# Patient Record
Sex: Male | Born: 1960 | Race: White | Hispanic: No | Marital: Married | Smoking: Never smoker
Health system: Southern US, Community
[De-identification: ages and names within clinical notes are randomized; demographics above are authoritative.]

## PROBLEM LIST (undated history)

## (undated) DIAGNOSIS — M5416 Radiculopathy, lumbar region: Secondary | ICD-10-CM

## (undated) DIAGNOSIS — M549 Dorsalgia, unspecified: Secondary | ICD-10-CM

## (undated) DIAGNOSIS — G8929 Other chronic pain: Secondary | ICD-10-CM

## (undated) DIAGNOSIS — E291 Testicular hypofunction: Secondary | ICD-10-CM

## (undated) DIAGNOSIS — M545 Low back pain, unspecified: Secondary | ICD-10-CM

## (undated) DIAGNOSIS — B070 Plantar wart: Secondary | ICD-10-CM

## (undated) DIAGNOSIS — M5090 Cervical disc disorder, unspecified, unspecified cervical region: Secondary | ICD-10-CM

## (undated) DIAGNOSIS — M542 Cervicalgia: Secondary | ICD-10-CM

## (undated) DIAGNOSIS — G8921 Chronic pain due to trauma: Secondary | ICD-10-CM

## (undated) DIAGNOSIS — R51 Headache: Secondary | ICD-10-CM

## (undated) DIAGNOSIS — R7611 Nonspecific reaction to tuberculin skin test without active tuberculosis: Secondary | ICD-10-CM

## (undated) DIAGNOSIS — I10 Essential (primary) hypertension: Secondary | ICD-10-CM

## (undated) DIAGNOSIS — B019 Varicella without complication: Secondary | ICD-10-CM

## (undated) DIAGNOSIS — E785 Hyperlipidemia, unspecified: Secondary | ICD-10-CM

## (undated) HISTORY — DX: Varicella without complication: B01.9

## (undated) HISTORY — DX: Cervical disc disorder, unspecified, unspecified cervical region: M50.90

## (undated) HISTORY — DX: Hyperlipidemia, unspecified: E78.5

## (undated) HISTORY — DX: Other chronic pain: G89.29

## (undated) HISTORY — DX: Headache: R51

## (undated) HISTORY — DX: Cervicalgia: M54.2

## (undated) HISTORY — DX: Dorsalgia, unspecified: M54.9

## (undated) HISTORY — DX: Nonspecific reaction to tuberculin skin test without active tuberculosis: R76.11

## (undated) HISTORY — DX: Low back pain: M54.5

## (undated) HISTORY — DX: Low back pain, unspecified: M54.50

## (undated) HISTORY — DX: Testicular hypofunction: E29.1

## (undated) HISTORY — PX: WISDOM TOOTH EXTRACTION: SHX21

## (undated) HISTORY — DX: Radiculopathy, lumbar region: M54.16

## (undated) HISTORY — DX: Essential (primary) hypertension: I10

## (undated) HISTORY — DX: Plantar wart: B07.0

## (undated) HISTORY — DX: Chronic pain due to trauma: G89.21

---

## 1982-09-21 HISTORY — PX: KNEE SURGERY: SHX244

## 1986-09-21 HISTORY — PX: APPENDECTOMY: SHX54

## 2000-09-21 HISTORY — PX: CHOLECYSTECTOMY: SHX55

## 2008-09-21 HISTORY — PX: LAMINECTOMY: SHX219

## 2009-09-21 HISTORY — PX: BACK SURGERY: SHX140

## 2011-12-23 ENCOUNTER — Ambulatory Visit (INDEPENDENT_AMBULATORY_CARE_PROVIDER_SITE_OTHER): Payer: Self-pay | Admitting: Family

## 2011-12-23 ENCOUNTER — Encounter: Payer: Self-pay | Admitting: Family

## 2011-12-23 VITALS — BP 170/100 | Temp 98.8°F | Ht 73.0 in | Wt 227.0 lb

## 2011-12-23 DIAGNOSIS — I10 Essential (primary) hypertension: Secondary | ICD-10-CM

## 2011-12-23 DIAGNOSIS — G8929 Other chronic pain: Secondary | ICD-10-CM

## 2011-12-23 DIAGNOSIS — M545 Low back pain, unspecified: Secondary | ICD-10-CM

## 2011-12-23 DIAGNOSIS — N529 Male erectile dysfunction, unspecified: Secondary | ICD-10-CM

## 2011-12-23 MED ORDER — AMLODIPINE BESYLATE 10 MG PO TABS: 10.0000 mg | ORAL_TABLET | Freq: Every day | ORAL | Status: DC

## 2011-12-23 MED ORDER — TADALAFIL 20 MG PO TABS: 10.0000 mg | ORAL_TABLET | ORAL | Status: DC | PRN

## 2011-12-23 MED ORDER — TRAMADOL HCL 50 MG PO TABS: 50.0000 mg | ORAL_TABLET | Freq: Three times a day (TID) | ORAL | Status: AC | PRN

## 2011-12-23 MED ORDER — LISINOPRIL-HYDROCHLOROTHIAZIDE 10-12.5 MG PO TABS: 1.0000 | ORAL_TABLET | Freq: Every day | ORAL | Status: DC
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
## 2011-12-23 NOTE — Patient Instructions (Addendum)
Hypertension As your heart beats, it forces blood through your arteries. This force is your blood pressure. If the pressure is too high, it is called hypertension (HTN) or high blood pressure. HTN is dangerous because you may have it and not know it. High blood pressure may mean that your heart has to work harder to pump blood. Your arteries may be narrow or stiff. The extra work puts you at risk for heart disease, stroke, and other problems.  Blood pressure consists of two numbers, a higher number over a lower, 110/72, for example. It is stated as "110 over 72." The ideal is below 120 for the top number (systolic) and under 80 for the bottom (diastolic). Write down your blood pressure today. You should pay close attention to your blood pressure if you have certain conditions such as:  Heart failure.   Prior heart attack.   Diabetes   Chronic kidney disease.   Prior stroke.   Multiple risk factors for heart disease.  To see if you have HTN, your blood pressure should be measured while you are seated with your arm held at the level of the heart. It should be measured at least twice. A one-time elevated blood pressure reading (especially in the Emergency Department) does not mean that you need treatment. There may be conditions in which the blood pressure is different between your right and left arms. It is important to see your caregiver soon for a recheck. Most people have essential hypertension which means that there is not a specific cause. This type of high blood pressure may be lowered by changing lifestyle factors such as:  Stress.   Smoking.   Lack of exercise.   Excessive weight.   Drug/tobacco/alcohol use.   Eating less salt.  Most people do not have symptoms from high blood pressure until it has caused damage to the body. Effective treatment can often prevent, delay or reduce that damage. TREATMENT  When a cause has been identified, treatment for high blood pressure is  directed at the cause. There are a large number of medications to treat HTN. These fall into several categories, and your caregiver will help you select the medicines that are best for you. Medications may have side effects. You should review side effects with your caregiver. If your blood pressure stays high after you have made lifestyle changes or started on medicines,   Your medication(s) may need to be changed.   Other problems may need to be addressed.   Be certain you understand your prescriptions, and know how and when to take your medicine.   Be sure to follow up with your caregiver within the time frame advised (usually within two weeks) to have your blood pressure rechecked and to review your medications.   If you are taking more than one medicine to lower your blood pressure, make sure you know how and at what times they should be taken. Taking two medicines at the same time can result in blood pressure that is too low.  SEEK IMMEDIATE MEDICAL CARE IF:  You develop a severe headache, blurred or changing vision, or confusion.   You have unusual weakness or numbness, or a faint feeling.   You have severe chest or abdominal pain, vomiting, or breathing problems.  MAKE SURE YOU:   Understand these instructions.   Will watch your condition.   Will get help right away if you are not doing well or get worse.  Document Released: 09/07/2005 Document Revised: 08/27/2011 Document Reviewed:   04/27/2008 ExitCare Patient Information 2012 [employer].  Exercise to Lose Weight Exercise and a healthy diet may help you lose weight. Your doctor may suggest specific exercises. EXERCISE IDEAS AND TIPS  Choose low-cost things you enjoy doing, such as walking, bicycling, or exercising to workout videos.   Take stairs instead of the elevator.   Walk during your lunch break.   Park your car further away from work or school.   Go to a gym or an exercise class.   Start with 5 to 10  minutes of exercise each day. Build up to 30 minutes of exercise 4 to 6 days a week.   Wear shoes with good support and comfortable clothes.   Stretch before and after working out.   Work out until you breathe harder and your heart beats faster.   Drink extra water when you exercise.   Do not do so much that you hurt yourself, feel dizzy, or get very short of breath.  Exercises that burn about 150 calories:  Running 1  miles in 15 minutes.   Playing volleyball for 45 to 60 minutes.   Washing and waxing a car for 45 to 60 minutes.   Playing touch football for 45 minutes.   Walking 1  miles in 35 minutes.   Pushing a stroller 1  miles in 30 minutes.   Playing basketball for 30 minutes.   Raking leaves for 30 minutes.   Bicycling 5 miles in 30 minutes.   Walking 2 miles in 30 minutes.   Dancing for 30 minutes.   Shoveling snow for 15 minutes.   Swimming laps for 20 minutes.   Walking up stairs for 15 minutes.   Bicycling 4 miles in 15 minutes.   Gardening for 30 to 45 minutes.   Jumping rope for 15 minutes.   Washing windows or floors for 45 to 60 minutes.  Document Released: 10/10/2010 Document Revised: 08/27/2011 Document Reviewed: 10/10/2010 Exit[employer] Patient Information 2012 [employer]. 

## 2011-12-29 ENCOUNTER — Telehealth: Payer: Self-pay | Admitting: Family Medicine

## 2011-12-29 MED ORDER — TIZANIDINE HCL 4 MG PO TABS: 4.0000 mg | ORAL_TABLET | Freq: Four times a day (QID) | ORAL | Status: AC | PRN

## 2011-12-29 NOTE — Telephone Encounter (Signed)
Pt now calls back and states he took an old zanaflex and really helped his back pain.  Requesting  a refill on the zanaflex  To [facility] [address] in [place], where he will be until Friday

## 2011-12-29 NOTE — Telephone Encounter (Signed)
Pulled from Triage vmail. Pt was off BP meds for a long time. Saw Padonda and she put him back on them. He is having a lot of cramping all over, especially in his back, where he has Hx of back surgery. [place] he can hardly move. Please call to advise if he should stop taking. Thanks.

## 2011-12-29 NOTE — Telephone Encounter (Signed)
Please call in Zanaflex for patient.

## 2011-12-29 NOTE — Telephone Encounter (Signed)
Rx sent to pharmacy and pt aware. 

## 2011-12-31 ENCOUNTER — Telehealth: Payer: Self-pay | Admitting: *Deleted

## 2011-12-31 MED ORDER — LISINOPRIL 10 MG PO TABS: 10.0000 mg | ORAL_TABLET | Freq: Every day | ORAL | Status: DC

## 2011-12-31 NOTE — Telephone Encounter (Signed)
Left message for pt to call back to notify him of change in medication and to return to office in 2 weeks. Rx for plain lisinopril sent to pharamcy

## 2011-12-31 NOTE — Telephone Encounter (Signed)
Start lisinopril 10mg  daily. Recheck in 2 weeks.

## 2011-12-31 NOTE — Telephone Encounter (Addendum)
Pt wanted Padonda to know the lisinopril/hctz caused him to become so dehydrated, he started to have severe back pains.  He  feels much better but feels he needs a different BP med.  He cannot come into the office today or tomorrow.  He states his entire body was cramping.

## 2012-01-06 ENCOUNTER — Other Ambulatory Visit: Payer: Self-pay

## 2012-01-13 ENCOUNTER — Encounter: Payer: Self-pay | Admitting: Family

## 2012-01-13 DIAGNOSIS — Z0289 Encounter for other administrative examinations: Secondary | ICD-10-CM

## 2012-02-16 ENCOUNTER — Telehealth: Payer: Self-pay | Admitting: Family

## 2012-02-16 NOTE — Telephone Encounter (Signed)
Pt called and said that the Fax # for [facility] in [place] is [phone]

## 2012-02-16 NOTE — Telephone Encounter (Signed)
Pt would like to have order for cpx labs drawn at [facility]. Pt is sch for cpx on 6-17. Pt will callback with fax#

## 2012-02-18 ENCOUNTER — Telehealth: Payer: Self-pay

## 2012-02-18 MED ORDER — TRAMADOL HCL 50 MG PO TABS
50.0000 mg | ORAL_TABLET | Freq: Three times a day (TID) | ORAL | Status: DC | PRN
Start: 2012-02-18 — End: 2014-06-04

## 2012-02-18 MED ORDER — TIZANIDINE HCL 4 MG PO CAPS: 4.0000 mg | ORAL_CAPSULE | Freq: Three times a day (TID) | ORAL | Status: DC

## 2012-02-18 NOTE — Telephone Encounter (Signed)
Orders faxed on 02/18/12 to number provided by pt

## 2012-02-18 NOTE — Telephone Encounter (Signed)
Sent to the pharmacy

## 2012-02-18 NOTE — Telephone Encounter (Signed)
Labs to include BMP, CBC, LIPIDS, PSA dx codes V70.0, V76.44, 401.1

## 2012-02-18 NOTE — Telephone Encounter (Signed)
Refill request for tizanidine 4mg  and tramadol 50 mg. Last filled 12/29/11 and 12/23/11

## 2012-03-07 ENCOUNTER — Encounter: Payer: Self-pay | Admitting: Family

## 2012-03-07 ENCOUNTER — Ambulatory Visit (INDEPENDENT_AMBULATORY_CARE_PROVIDER_SITE_OTHER): Payer: 59 | Admitting: Family

## 2012-03-07 VITALS — BP 140/94 | Ht 73.0 in | Wt 219.0 lb

## 2012-03-07 DIAGNOSIS — R7309 Other abnormal glucose: Secondary | ICD-10-CM

## 2012-03-07 DIAGNOSIS — E7800 Pure hypercholesterolemia, unspecified: Secondary | ICD-10-CM

## 2012-03-07 DIAGNOSIS — Z0000 Encounter for general adult medical examination without abnormal findings: Secondary | ICD-10-CM

## 2012-03-07 DIAGNOSIS — I10 Essential (primary) hypertension: Secondary | ICD-10-CM

## 2012-03-07 DIAGNOSIS — R739 Hyperglycemia, unspecified: Secondary | ICD-10-CM

## 2012-03-07 LAB — HEMOGLOBIN A1C: Hgb A1c MFr Bld: 6.2 % (ref 4.6–6.5)

## 2012-03-07 MED ORDER — LISINOPRIL 10 MG PO TABS: 10.0000 mg | ORAL_TABLET | Freq: Every day | ORAL | Status: DC

## 2012-03-07 MED ORDER — CLOTRIMAZOLE-BETAMETHASONE 1-0.05 % EX CREA: TOPICAL_CREAM | Freq: Two times a day (BID) | CUTANEOUS | Status: AC

## 2012-03-07 NOTE — Progress Notes (Signed)
Subjective:    Patient ID: Charles Jordan, male    DOB: [birth year], 51 y.o.   MRN: [id number]  HPI 51 year old white male,  patient presents for yearly preventative medicine examination. All immunizations and health maintenance protocols were reviewed with the patient and they are up to date with these protocols. Screening laboratory values were reviewed with the patient including screening of hyperlipidemia PSA renal function and hepatic function. There medications past medical history social history problem list and allergies were reviewed in detail. Goals were established with regard to weight loss exercise diet in compliance with medications  Patient has discontinued his lisinopril HCT. He reports a cough and dehydration and muscle aches. He continues to take Norvasc.   Review of Systems  Constitutional: Negative.   HENT: Negative.   Eyes: Negative.   Respiratory: Negative.   Cardiovascular: Negative.   Gastrointestinal: Negative.   Genitourinary: Negative.   Musculoskeletal: Negative.   Skin: Negative.   Neurological: Negative.   Hematological: Negative.   Psychiatric/Behavioral: Negative.    Past Medical History  Diagnosis Date  . Hyperlipidemia   . Hypertension   . Positive TB test   . Headache     History   Social History  . Marital Status: Married    Spouse Name: N/A    Number of Children: N/A  . Years of Education: N/A   Occupational History  . Not on file.   Social History Main Topics  . Smoking status: Never Smoker   . Smokeless tobacco: Not on file  . Alcohol Use: Yes  . Drug Use: No  . Sexually Active:    Other Topics Concern  . Not on file   Social History Narrative  . No narrative on file    Past Surgical History  Procedure Date  . Appendectomy   . Cholecystectomy   . Knee surgery     Family History  Problem Relation Age of Onset  . Adopted: Yes    No Known Allergies  Current Outpatient Prescriptions on File Prior to Visit    Medication Sig Dispense Refill  . amLODipine (NORVASC) 10 MG tablet Take 1 tablet (10 mg total) by mouth daily.  30 tablet  3  . tiZANidine (ZANAFLEX) 4 MG capsule Take 1 capsule (4 mg total) by mouth 3 (three) times daily.  90 capsule  2  . lisinopril-hydrochlorothiazide (PRINZIDE,ZESTORETIC) 10-12.5 MG per tablet Take 1 tablet by mouth daily.  30 tablet  3  . tadalafil (CIALIS) 20 MG tablet Take 0.5-1 tablets (10-20 mg total) by mouth every other day as needed for erectile dysfunction.  5 tablet  11  . DISCONTD: lisinopril (PRINIVIL) 10 MG tablet Take 1 tablet (10 mg total) by mouth daily.  30 tablet  0    BP 140/94  Ht 6\' 1"  (1.854 m)  Wt 219 lb (99.338 kg)  BMI 28.89 kg/m2chart     Objective:   Physical Exam  Constitutional: He is oriented to person, place, and time. He appears well-developed and well-nourished.  HENT:  Head: Normocephalic and atraumatic.  Right Ear: External ear normal.  Left Ear: External ear normal.  Nose: Nose normal.  Mouth/Throat: Oropharynx is clear and moist.  Eyes: Conjunctivae are normal. Pupils are equal, round, and reactive to light.  Neck: Normal range of motion. Neck supple.  Cardiovascular: Normal rate, regular rhythm, normal heart sounds and intact distal pulses.  Exam reveals no gallop.   No murmur heard. Pulmonary/Chest: Effort normal and breath sounds normal.  Abdominal: Soft. Bowel  sounds are normal.  Genitourinary: Rectum normal, prostate normal and penis normal. Guaiac negative stool. No penile tenderness.  Musculoskeletal: Normal range of motion.  Neurological: He is alert and oriented to person, place, and time. He has normal reflexes.  Skin: Skin is warm and dry.  Psychiatric: He has a normal mood and affect.          Assessment & Plan:  Assessment: Complete physical exam, hypertension, hyperlipidemia, erectile dysfunction  Plan: DC lisinopril HCT and start lisinopril 10 mg once daily. Continue Norvasc. Encouraged healthy diet  and exercise. Renew prescription for Cialis. We'll recheck  will patient in 6 months and sooner when necessary. A1c sent.

## 2012-03-07 NOTE — Patient Instructions (Signed)
Exercise to Stay Healthy  Exercise helps you become and stay healthy.    EXERCISE IDEAS AND TIPS  Choose exercises that:   You enjoy.   Fit into your day.  You do not need to exercise really hard to be healthy. You can do exercises at a slow or medium level and stay healthy. You can:   Stretch before and after working out.   Try yoga, Pilates, or tai chi.   Lift weights.   Walk fast, swim, jog, run, climb stairs, bicycle, dance, or rollerskate.   Take aerobic classes.    Exercises that burn about 150 calories:     Running 1  miles in 15 minutes.   Playing volleyball for 45 to 60 minutes.   Washing and waxing a car for 45 to 60 minutes.   Playing touch football for 45 minutes.   Walking 1  miles in 35 minutes.   Pushing a stroller 1  miles in 30 minutes.   Playing basketball for 30 minutes.   Raking leaves for 30 minutes.   Bicycling 5 miles in 30 minutes.   Walking 2 miles in 30 minutes.   Dancing for 30 minutes.   Shoveling snow for 15 minutes.   Swimming laps for 20 minutes.   Walking up stairs for 15 minutes.   Bicycling 4 miles in 15 minutes.   Gardening for 30 to 45 minutes.   Jumping rope for 15 minutes.   Washing windows or floors for 45 to 60 minutes.  Document Released: 10/10/2010 Document Revised: 08/27/2011 Document Reviewed: 10/10/2010  [employer] Patient Information 2012 [employer].

## 2012-03-09 ENCOUNTER — Encounter: Payer: Self-pay | Admitting: Family

## 2012-04-19 ENCOUNTER — Telehealth: Payer: Self-pay | Admitting: Family

## 2012-04-19 MED ORDER — TRAMADOL HCL 50 MG PO TABS: 50.0000 mg | ORAL_TABLET | Freq: Four times a day (QID) | ORAL | Status: DC | PRN

## 2012-04-19 NOTE — Telephone Encounter (Signed)
Caller: Charles Jordan/Patient; PCP: Adline Mango; CB#: [phone]; Call regarding Taking  Extra Ultram and Is Completely Out. Not Sure If They Are Working. Lower back is hurts at  L5- S1 area and pain radiates to R Hip. Normally They Work Good But Lately Not Helping;  He is wondering if a different Non Narcotic Medication can be called in- maybe a muscle  relaxer that would not make him sleepy. Triage per Back Sx Protocol and Disp changed to Call  Provider within 24 hours per caller request. Also, WOULD LIKE REFERRALTO SEE  NEUROLOGIST TO ADDRESS HEADACHES AND MEMORY LOSS. Uses [facility]  In [place].

## 2012-04-19 NOTE — Telephone Encounter (Signed)
Rx sent to pharmacy and note sent to [facility] for approval of neurology ref

## 2012-05-15 ENCOUNTER — Other Ambulatory Visit: Payer: Self-pay | Admitting: Family

## 2012-05-18 ENCOUNTER — Encounter: Payer: 59 | Admitting: Internal Medicine

## 2012-05-20 ENCOUNTER — Other Ambulatory Visit: Payer: Self-pay | Admitting: Family

## 2012-06-29 ENCOUNTER — Other Ambulatory Visit: Payer: Self-pay | Admitting: Family

## 2012-07-09 ENCOUNTER — Other Ambulatory Visit: Payer: Self-pay | Admitting: Family

## 2012-07-25 ENCOUNTER — Other Ambulatory Visit: Payer: Self-pay | Admitting: Family

## 2012-08-04 ENCOUNTER — Other Ambulatory Visit (HOSPITAL_COMMUNITY): Payer: Self-pay | Admitting: Family Medicine

## 2012-08-04 DIAGNOSIS — R202 Paresthesia of skin: Secondary | ICD-10-CM

## 2012-08-05 ENCOUNTER — Ambulatory Visit (HOSPITAL_COMMUNITY)
Admission: RE | Admit: 2012-08-05 | Discharge: 2012-08-05 | Disposition: A | Discharge status: Home / Self Care | Payer: 59 | Source: Ambulatory Visit | Attending: Family Medicine | Admitting: Family Medicine

## 2012-08-05 DIAGNOSIS — R202 Paresthesia of skin: Secondary | ICD-10-CM

## 2012-08-05 DIAGNOSIS — M5020 Other cervical disc displacement, unspecified cervical region: Secondary | ICD-10-CM | POA: Insufficient documentation

## 2012-08-25 ENCOUNTER — Other Ambulatory Visit: Payer: Self-pay | Admitting: Family

## 2012-09-02 ENCOUNTER — Other Ambulatory Visit: Payer: Self-pay | Admitting: Family

## 2012-09-21 HISTORY — PX: TOOTH EXTRACTION: SUR596

## 2012-10-20 ENCOUNTER — Other Ambulatory Visit: Payer: Self-pay

## 2012-10-20 MED ORDER — TIZANIDINE HCL 4 MG PO CAPS: 4.0000 mg | ORAL_CAPSULE | Freq: Three times a day (TID) | ORAL | Status: DC

## 2012-10-20 NOTE — Telephone Encounter (Signed)
1 MONTH SUPPLY SENT TO [facility] WITH NOTE STATING NO FURTHER REFILLS WITHOUT OV. MESSAGE LEFT ON BOTH CONTACT NUMBERS FOR PT ADVISING HIM TO RETURN CALL

## 2013-01-11 ENCOUNTER — Other Ambulatory Visit: Payer: Self-pay | Admitting: Family

## 2013-05-25 ENCOUNTER — Encounter: Payer: Self-pay | Admitting: Physician Assistant

## 2013-05-25 ENCOUNTER — Ambulatory Visit (INDEPENDENT_AMBULATORY_CARE_PROVIDER_SITE_OTHER): Payer: 59 | Admitting: Physician Assistant

## 2013-05-25 VITALS — BP 128/92 | HR 84 | Temp 98.1°F | Resp 16 | Ht 72.0 in | Wt 223.2 lb

## 2013-05-25 DIAGNOSIS — K219 Gastro-esophageal reflux disease without esophagitis: Secondary | ICD-10-CM

## 2013-05-25 DIAGNOSIS — G8929 Other chronic pain: Secondary | ICD-10-CM

## 2013-05-25 DIAGNOSIS — I10 Essential (primary) hypertension: Secondary | ICD-10-CM | POA: Insufficient documentation

## 2013-05-25 DIAGNOSIS — Z0000 Encounter for general adult medical examination without abnormal findings: Secondary | ICD-10-CM | POA: Insufficient documentation

## 2013-05-25 DIAGNOSIS — Z23 Encounter for immunization: Secondary | ICD-10-CM

## 2013-05-25 LAB — HEPATIC FUNCTION PANEL
ALT: 23 U/L (ref 0–53)
AST: 16 U/L (ref 0–37)
Alkaline Phosphatase: 89 U/L (ref 39–117)
Total Protein: 6.6 g/dL (ref 6.0–8.3)

## 2013-05-25 LAB — CBC WITH DIFFERENTIAL/PLATELET
Hemoglobin: 14.2 g/dL (ref 13.0–17.0)
Lymphocytes Relative: 26 % (ref 12–46)
Lymphs Abs: 1.5 10*3/uL (ref 0.7–4.0)
Neutrophils Relative %: 63 % (ref 43–77)
Platelets: 236 10*3/uL (ref 150–400)
RBC: 4.65 MIL/uL (ref 4.22–5.81)
WBC: 5.8 10*3/uL (ref 4.0–10.5)

## 2013-05-25 LAB — LIPID PANEL
HDL: 32 mg/dL — ABNORMAL LOW (ref >39)
Triglycerides: 1419 mg/dL — ABNORMAL HIGH (ref <150)

## 2013-05-25 LAB — BASIC METABOLIC PANEL
CO2: 26 mEq/L (ref 19–32)
Chloride: 101 mEq/L (ref 96–112)
Potassium: 3.7 mEq/L (ref 3.5–5.3)
Sodium: 138 mEq/L (ref 135–145)

## 2013-05-25 LAB — HEMOGLOBIN A1C: Mean Plasma Glucose: 137 mg/dL — ABNORMAL HIGH (ref <117)

## 2013-05-25 MED ORDER — OMEPRAZOLE 20 MG PO CPDR: 20.0000 mg | DELAYED_RELEASE_CAPSULE | Freq: Every day | ORAL | Status: DC

## 2013-05-25 MED ORDER — TRAMADOL HCL 50 MG PO TABS: 100.0000 mg | ORAL_TABLET | Freq: Three times a day (TID) | ORAL | Status: DC | PRN

## 2013-05-25 MED ORDER — LOSARTAN POTASSIUM 25 MG PO TABS: 25.0000 mg | ORAL_TABLET | Freq: Every day | ORAL | Status: DC

## 2013-05-25 NOTE — Assessment & Plan Note (Signed)
Most likely cause of chronic cough due to symptoms.  Trial of omeprazole.  Encouraged weight loss.  Patient instructed on proper diet and to avoid trigger foods, eating late at night, and elevating head of bed.  Follow-up in 1 month

## 2013-05-25 NOTE — Assessment & Plan Note (Addendum)
D/c lisinopril as potential cause of cough.  Rx for losartan to be added with daily amlodipine.  BP recheck in 1 month.  Encourage BP checks 2 x week.  Information on DASH diet given.

## 2013-05-25 NOTE — Assessment & Plan Note (Signed)
Fasting labs today.  Influenza vaccination given.  Referral to GI for colonoscopy.
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
## 2013-05-25 NOTE — Patient Instructions (Addendum)
Please stop lisinopril and start the losartan (cozaar) daily.  Check BP at home.  Start omeprazole daily 30 minutes before breakfast.  Please read information below about acid reflux and lifestyle changes to help with symptoms You can take 2 of the tramadol every 6-8 hours for pain.   I want to see you in 1 month to reassess blood pressure and symptoms.  I will call you with your lab results.    Gastroesophageal Reflux Disease, Adult Gastroesophageal reflux disease (GERD) happens when acid from your stomach flows up into the esophagus. When acid comes in contact with the esophagus, the acid causes soreness (inflammation) in the esophagus. Over time, GERD may create small holes (ulcers) in the lining of the esophagus. CAUSES   Increased body weight. This puts pressure on the stomach, making acid rise from the stomach into the esophagus.  Smoking. This increases acid production in the stomach.  Drinking alcohol. This causes decreased pressure in the lower esophageal sphincter (valve or ring of muscle between the esophagus and stomach), allowing acid from the stomach into the esophagus.  Late evening meals and a full stomach. This increases pressure and acid production in the stomach.  A malformed lower esophageal sphincter. Sometimes, no cause is found. SYMPTOMS   Burning pain in the lower part of the mid-chest behind the breastbone and in the mid-stomach area. This may occur twice a week or more often.  Trouble swallowing.  Sore throat.  Dry cough.  Asthma-like symptoms including chest tightness, shortness of breath, or wheezing. DIAGNOSIS  Your caregiver may be able to diagnose GERD based on your symptoms. In some cases, X-rays and other tests may be done to check for complications or to check the condition of your stomach and esophagus. TREATMENT  Your caregiver may recommend over-the-counter or prescription medicines to help decrease acid production. Ask your caregiver before  starting or adding any new medicines.  HOME CARE INSTRUCTIONS   Change the factors that you can control. Ask your caregiver for guidance concerning weight loss, quitting smoking, and alcohol consumption.  Avoid foods and drinks that make your symptoms worse, such as:  Caffeine or alcoholic drinks.  Chocolate.  Peppermint or mint flavorings.  Garlic and onions.  Spicy foods.  Citrus fruits, such as oranges, lemons, or limes.  Tomato-based foods such as sauce, chili, salsa, and pizza.  Fried and fatty foods.  Avoid lying down for the 3 hours prior to your bedtime or prior to taking a nap.  Eat small, frequent meals instead of large meals.  Wear loose-fitting clothing. Do not wear anything tight around your waist that causes pressure on your stomach.  Raise the head of your bed 6 to 8 inches with wood blocks to help you sleep. Extra pillows will not help.  Only take over-the-counter or prescription medicines for pain, discomfort, or fever as directed by your caregiver.  Do not take aspirin, ibuprofen, or other nonsteroidal anti-inflammatory drugs (NSAIDs). SEEK IMMEDIATE MEDICAL CARE IF:   You have pain in your arms, neck, jaw, teeth, or back.  Your pain increases or changes in intensity or duration.  You develop nausea, vomiting, or sweating (diaphoresis).  You develop shortness of breath, or you faint.  Your vomit is green, yellow, black, or looks like coffee grounds or blood.  Your stool is red, bloody, or black. These symptoms could be signs of other problems, such as heart disease, gastric bleeding, or esophageal bleeding. MAKE SURE YOU:   Understand these instructions.  Will watch  your condition.  Will get help right away if you are not doing well or get worse. Document Released: 06/17/2005 Document Revised: 11/30/2011 Document Reviewed: 03/27/2011 [facility] Patient Information 2014 [place], [place].

## 2013-05-25 NOTE — Assessment & Plan Note (Signed)
Increase tramadol to 100 mg from 50 mg.  If pain worsens, will need return to orthopedics or possible pain management referral.

## 2013-05-26 ENCOUNTER — Telehealth: Payer: Self-pay | Admitting: Physician Assistant

## 2013-05-26 DIAGNOSIS — E781 Pure hyperglyceridemia: Secondary | ICD-10-CM | POA: Insufficient documentation

## 2013-05-26 LAB — URINALYSIS, MICROSCOPIC ONLY
Bacteria, UA: NONE SEEN
Casts: NONE SEEN
Crystals: NONE SEEN

## 2013-05-26 LAB — URINALYSIS, ROUTINE W REFLEX MICROSCOPIC
Hgb urine dipstick: NEGATIVE
Ketones, ur: NEGATIVE mg/dL
Nitrite: NEGATIVE
pH: 5.5 (ref 5.0–8.0)

## 2013-05-26 MED ORDER — FENOFIBRATE 48 MG PO TABS: 48.0000 mg | ORAL_TABLET | Freq: Every day | ORAL | Status: DC

## 2013-05-26 NOTE — Telephone Encounter (Signed)
Please inform patient that his lab results are in.  His triglycerides are extremely elevated so I want him to start a diet low in fatty foods and refined sugars.  More lean protein (grilled chicken), fruits and vegetables.  Water instead of sodas.  Also he needs to start taking a daily krill oil supplement over the counter.  I pill per day.  Also I have sent in a prescription for fenofibrate that he is to take daily.  I will need to see him in 1 month to recheck his labs.  Patient should also be aware that his A1C places him in the pre-diabetic range but he is very close to diagnosis of diabetes.  Encourage diet (as above) and exercise to help lose weight.  Will recheck his A1C in 3 months

## 2013-05-26 NOTE — Telephone Encounter (Signed)
Patient informed, understood & agreed; has appt scheduled 10.08.14/SLS

## 2013-05-26 NOTE — Assessment & Plan Note (Signed)
Rx fenofibrate.  Daily krill oil.  Follow-up in 1 month.

## 2013-05-29 ENCOUNTER — Telehealth: Payer: Self-pay | Admitting: Physician Assistant

## 2013-05-29 NOTE — Telephone Encounter (Signed)
Received medical records from [facility]  P: [phone] F: [phone]

## 2013-06-05 ENCOUNTER — Telehealth: Payer: Self-pay | Admitting: Physician Assistant

## 2013-06-05 NOTE — Telephone Encounter (Signed)
Received medical records from [facility] °

## 2013-06-14 ENCOUNTER — Encounter: Payer: Self-pay | Admitting: Physician Assistant

## 2013-06-16 NOTE — Telephone Encounter (Signed)
Reply sent and provider verified 09.26.14/SLS

## 2013-06-28 ENCOUNTER — Ambulatory Visit: Payer: 59 | Admitting: Physician Assistant

## 2013-06-29 ENCOUNTER — Encounter: Payer: Self-pay | Admitting: Physician Assistant

## 2013-06-29 ENCOUNTER — Ambulatory Visit (INDEPENDENT_AMBULATORY_CARE_PROVIDER_SITE_OTHER): Payer: 59 | Admitting: Physician Assistant

## 2013-06-29 VITALS — BP 126/90 | HR 63 | Temp 98.5°F | Resp 16 | Ht 72.0 in | Wt 212.8 lb

## 2013-06-29 DIAGNOSIS — G8929 Other chronic pain: Secondary | ICD-10-CM

## 2013-06-29 DIAGNOSIS — I10 Essential (primary) hypertension: Secondary | ICD-10-CM

## 2013-06-29 DIAGNOSIS — E781 Pure hyperglyceridemia: Secondary | ICD-10-CM

## 2013-06-29 LAB — COMPREHENSIVE METABOLIC PANEL WITH GFR
ALT: 29 U/L (ref 0–53)
AST: 22 U/L (ref 0–37)
Albumin: 4.6 g/dL (ref 3.5–5.2)
Alkaline Phosphatase: 76 U/L (ref 39–117)
BUN: 19 mg/dL (ref 6–23)
CO2: 32 meq/L (ref 19–32)
Calcium: 9.6 mg/dL (ref 8.4–10.5)
Chloride: 100 meq/L (ref 96–112)
Creat: 1.01 mg/dL (ref 0.50–1.35)
Glucose, Bld: 102 mg/dL — ABNORMAL HIGH (ref 70–99)
Potassium: 4.2 meq/L (ref 3.5–5.3)
Sodium: 140 meq/L (ref 135–145)
Total Bilirubin: 0.9 mg/dL (ref 0.3–1.2)
Total Protein: 6.8 g/dL (ref 6.0–8.3)

## 2013-06-29 LAB — LIPID PANEL
Cholesterol: 158 mg/dL (ref 0–200)
HDL: 48 mg/dL
LDL Cholesterol: 96 mg/dL (ref 0–99)
Total CHOL/HDL Ratio: 3.3 ratio
Triglycerides: 69 mg/dL
VLDL: 14 mg/dL (ref 0–40)

## 2013-06-29 NOTE — Assessment & Plan Note (Signed)
Will recheck fasting lipid profile today. Will alter treatment is necessary

## 2013-06-29 NOTE — Patient Instructions (Signed)
Please obtain labs.  i will call you with your results.  Please continue all medications as prescribed.  Continue with diet and exercise changes.

## 2013-06-29 NOTE — Assessment & Plan Note (Signed)
Continue current medication regimen.    Continue with diet and exercise

## 2013-06-29 NOTE — Assessment & Plan Note (Signed)
Well controlled with tramadol.
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
## 2013-07-02 ENCOUNTER — Encounter: Payer: Self-pay | Admitting: Physician Assistant

## 2013-07-03 ENCOUNTER — Telehealth: Payer: Self-pay | Admitting: Physician Assistant

## 2013-07-03 ENCOUNTER — Other Ambulatory Visit: Payer: Self-pay | Admitting: *Deleted

## 2013-07-03 DIAGNOSIS — G8929 Other chronic pain: Secondary | ICD-10-CM

## 2013-07-03 MED ORDER — TRAMADOL HCL 50 MG PO TABS: 100.0000 mg | ORAL_TABLET | Freq: Three times a day (TID) | ORAL | Status: DC | PRN

## 2013-07-03 NOTE — Telephone Encounter (Signed)
Please Advise/SLS  

## 2013-07-03 NOTE — Progress Notes (Signed)
Per provider VO, called pharmacy for instructions for early refill & was instructed to send new Rx with note: [Ok to fill early 10.13.14]; Rx printed, signed & faxed to [facility]. Pt will be informed via response in My Chart/SLS

## 2013-07-03 NOTE — Telephone Encounter (Signed)
Sent MyChart message to patient.  Refill tramadol with note to pharmacy that it is ok to refill today.  Will need pain management referral if wanting chronic Rx pain management.

## 2013-07-22 ENCOUNTER — Other Ambulatory Visit: Payer: Self-pay | Admitting: Physician Assistant

## 2013-09-08 ENCOUNTER — Other Ambulatory Visit: Payer: Self-pay | Admitting: Physician Assistant

## 2013-09-08 DIAGNOSIS — G8929 Other chronic pain: Secondary | ICD-10-CM

## 2013-09-11 NOTE — Telephone Encounter (Signed)
Refill given.  Quantity 90 with no additional refills.  Rx printed, signed and ready for fax or pickup.

## 2013-09-11 NOTE — Telephone Encounter (Signed)
Rx faxed to [facility]; [facility] with contact name and number RE: requested Rx to pharmacy/SLS

## 2013-09-11 NOTE — Telephone Encounter (Signed)
eScribe request for refill on Tramadol Last filled - 10.13.14, #[id number] Last AEX - 10.09.14 Next AEX - 2 Months, no appointment scheduled Please Advise/SLS

## 2013-10-06 ENCOUNTER — Other Ambulatory Visit: Payer: Self-pay | Admitting: *Deleted

## 2013-10-06 DIAGNOSIS — E781 Pure hyperglyceridemia: Secondary | ICD-10-CM

## 2013-10-06 MED ORDER — LOSARTAN POTASSIUM 25 MG PO TABS: ORAL_TABLET | ORAL | Status: DC

## 2013-10-06 MED ORDER — FENOFIBRATE 48 MG PO TABS: 48.0000 mg | ORAL_TABLET | Freq: Every day | ORAL | Status: DC

## 2013-10-06 NOTE — Telephone Encounter (Signed)
Rx request to pharmacy/SLS  

## 2013-10-17 ENCOUNTER — Encounter: Payer: Self-pay | Admitting: Physician Assistant

## 2013-10-18 ENCOUNTER — Encounter: Payer: Self-pay | Admitting: Physician Assistant

## 2013-10-18 NOTE — Telephone Encounter (Signed)
Will check with Admin side of office for further information on My Chart proxy forms/SLS

## 2013-10-27 ENCOUNTER — Encounter: Payer: Self-pay | Admitting: Physician Assistant

## 2013-10-27 ENCOUNTER — Ambulatory Visit (INDEPENDENT_AMBULATORY_CARE_PROVIDER_SITE_OTHER): Payer: 59 | Admitting: Physician Assistant

## 2013-10-27 VITALS — BP 130/78 | HR 71 | Temp 97.5°F | Resp 16 | Ht 72.0 in | Wt 227.2 lb

## 2013-10-27 DIAGNOSIS — B351 Tinea unguium: Secondary | ICD-10-CM

## 2013-10-27 DIAGNOSIS — L919 Hypertrophic disorder of the skin, unspecified: Secondary | ICD-10-CM

## 2013-10-27 DIAGNOSIS — G8929 Other chronic pain: Secondary | ICD-10-CM

## 2013-10-27 DIAGNOSIS — E291 Testicular hypofunction: Secondary | ICD-10-CM

## 2013-10-27 DIAGNOSIS — L909 Atrophic disorder of skin, unspecified: Secondary | ICD-10-CM

## 2013-10-27 DIAGNOSIS — L03039 Cellulitis of unspecified toe: Secondary | ICD-10-CM

## 2013-10-27 DIAGNOSIS — L0390 Cellulitis, unspecified: Secondary | ICD-10-CM

## 2013-10-27 DIAGNOSIS — L03032 Cellulitis of left toe: Secondary | ICD-10-CM

## 2013-10-27 DIAGNOSIS — IMO0002 Reserved for concepts with insufficient information to code with codable children: Secondary | ICD-10-CM

## 2013-10-27 DIAGNOSIS — L0291 Cutaneous abscess, unspecified: Secondary | ICD-10-CM

## 2013-10-27 DIAGNOSIS — R7989 Other specified abnormal findings of blood chemistry: Secondary | ICD-10-CM

## 2013-10-27 DIAGNOSIS — L918 Other hypertrophic disorders of the skin: Secondary | ICD-10-CM

## 2013-10-27 LAB — CBC WITH DIFFERENTIAL/PLATELET
Basophils Absolute: 0 10*3/uL (ref 0.0–0.1)
Basophils Relative: 0.6 % (ref 0.0–3.0)
EOS PCT: 2.2 % (ref 0.0–5.0)
Eosinophils Absolute: 0.2 10*3/uL (ref 0.0–0.7)
HEMATOCRIT: 44.6 % (ref 39.0–52.0)
HEMOGLOBIN: 14.6 g/dL (ref 13.0–17.0)
Lymphocytes Relative: 35.8 % (ref 12.0–46.0)
Lymphs Abs: 2.6 10*3/uL (ref 0.7–4.0)
MCHC: 32.7 g/dL (ref 30.0–36.0)
MCV: 93.2 fl (ref 78.0–100.0)
MONO ABS: 0.5 10*3/uL (ref 0.1–1.0)
MONOS PCT: 6.2 % (ref 3.0–12.0)
NEUTROS ABS: 4.1 10*3/uL (ref 1.4–7.7)
Neutrophils Relative %: 55.2 % (ref 43.0–77.0)
Platelets: 266 10*3/uL (ref 150.0–400.0)
RBC: 4.79 Mil/uL (ref 4.22–5.81)
RDW: 12.5 % (ref 11.5–14.6)
WBC: 7.4 10*3/uL (ref 4.5–10.5)

## 2013-10-27 LAB — FOLLICLE STIMULATING HORMONE: FSH: 5.7 m[IU]/mL (ref 1.4–18.1)

## 2013-10-27 LAB — LUTEINIZING HORMONE: LH: 2.4 m[IU]/mL (ref 1.50–9.30)

## 2013-10-27 MED ORDER — SULFAMETHOXAZOLE-TMP DS 800-160 MG PO TABS: 1.0000 | ORAL_TABLET | Freq: Two times a day (BID) | ORAL | Status: DC
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
## 2013-10-27 NOTE — Progress Notes (Signed)
Pre visit review using our clinic review tool, if applicable. No additional management support is needed unless otherwise documented below in the visit note/SLS  

## 2013-10-27 NOTE — Patient Instructions (Signed)
Please take antibiotic as prescribed.  Soak foot in peroxide nightly.  Keep clean, dry and covered.  You will be contacted by podiatry for assessment and treatment -- Appointment is on Wednesday 11/01/13 at 1:45 pm with [facility].  If you develop worsening pain, redness, fever or chills, please proceed to the ER. Please send in your records of previous MRIs so that we can proceed with referrals.  Go to lab.  I will call you with your results.  We will start therapy if indicated.

## 2013-10-30 DIAGNOSIS — R7989 Other specified abnormal findings of blood chemistry: Secondary | ICD-10-CM | POA: Insufficient documentation

## 2013-10-30 DIAGNOSIS — IMO0002 Reserved for concepts with insufficient information to code with codable children: Secondary | ICD-10-CM | POA: Insufficient documentation

## 2013-10-30 DIAGNOSIS — L918 Other hypertrophic disorders of the skin: Secondary | ICD-10-CM | POA: Insufficient documentation

## 2013-10-30 DIAGNOSIS — B351 Tinea unguium: Secondary | ICD-10-CM | POA: Insufficient documentation

## 2013-10-30 LAB — TESTOSTERONE, FREE, TOTAL, SHBG
SEX HORMONE BINDING: 28 nmol/L (ref 13–71)
TESTOSTERONE FREE: 53.5 pg/mL (ref 47.0–244.0)
Testosterone-% Free: 2.1 % (ref 1.6–2.9)
Testosterone: 252 ng/dL — ABNORMAL LOW (ref 300–890)

## 2013-10-30 NOTE — Assessment & Plan Note (Signed)
Need to obtain recent MRI imaging and notes from previous caregiver.  Will place referral to pain clinic once records have been obtained.

## 2013-10-30 NOTE — Assessment & Plan Note (Signed)
Patient with extensive disease.  Also an abnormal growth of nail bed of Left great toe.  Will refer to Podiatry for urgent evaluation.  Will defer treatment for onychomycosis to Podiatry for now.  Will happily start therapy myself, once the urgent concerns are addressed by specialist.

## 2013-10-30 NOTE — Assessment & Plan Note (Signed)
Right Axillary x 2.  Area was prepped/cleaned with alcohol.  Clean technique observed.  Anesthesia with 1% lidocaine applied to base of each skin tag.  Skin tags removed via simple excision. Bleeding minimal and self-resolving.  Area was dressed and bandaged.

## 2013-10-30 NOTE — Assessment & Plan Note (Signed)
Patient endorses history of Low T.  Wants to restart medication.  Will obtain free and total Testosterone levels, LH and FSH. Will begin therapy if clinically indicated.

## 2013-10-30 NOTE — Assessment & Plan Note (Signed)
Great toe of left foot.  With evidence of trauma and drainage.  Encouraged foot soaks with dilute peroxide.  Discusses cleaning and dressing measures. Rx ABX.  No bony tenderness noted on examination.  Lower concern for osteomyelitis.  However, discussed alarm signs/symptoms with the patient and when to proceed to ER.  Urgent referral to podiatry placed.

## 2013-11-01 ENCOUNTER — Ambulatory Visit (INDEPENDENT_AMBULATORY_CARE_PROVIDER_SITE_OTHER): Payer: 59 | Admitting: Podiatrist

## 2013-11-01 ENCOUNTER — Encounter: Payer: Self-pay | Admitting: *Deleted

## 2013-11-01 VITALS — BP 127/84 | HR 84 | Resp 16 | Ht 73.0 in | Wt 227.0 lb

## 2013-11-01 DIAGNOSIS — L03039 Cellulitis of unspecified toe: Secondary | ICD-10-CM

## 2013-11-01 MED ORDER — TERBINAFINE HCL 250 MG PO TABS: 250.0000 mg | ORAL_TABLET | Freq: Every day | ORAL | Status: DC

## 2013-11-01 NOTE — Patient Instructions (Signed)
ANTIBACTERIAL SOAP INSTRUCTIONS  THE DAY AFTER PROCEDURE  Please follow the instructions your doctor has marked.   Shower as usual. Before getting out, place a drop of antibacterial liquid soap (Dial) on a wet, clean washcloth.  Gently wipe washcloth over affected area.  Afterward, rinse the area with warm   water.  Blot the area dry with a soft cloth and cover with antibiotic ointment (neosporin, polysporin, bacitracin) and band aid or gauze and tape    OR Place 3-4 drops of antibacterial liquid soap in a quart of warm tap water.  Submerge foot into water for 20 minutes.  If bandage was applied after your procedure, leave on to allow for easy lift off,                          then remove and continue with soak for the remaining time.  Next, blot area dry with a soft cloth and cover with a bandage.  Apply other medications as directed by your doctor, such as cortisporin otic solution (eardrops) or neosporin antibiotic ointment    Onychomycosis/Fungal Toenails  WHAT IS IT? An infection that lies within the keratin of your nail plate that is caused by a fungus.  WHY ME? Fungal infections affect all ages, sexes, races, and creeds.  There may be many factors that predispose you to a fungal infection such as age, coexisting medical conditions such as diabetes, or an autoimmune disease; stress, medications, fatigue, genetics, etc.  Bottom line: fungus thrives in a warm, moist environment and your shoes offer such a location.  IS IT CONTAGIOUS? Theoretically, yes.  You do not want to share shoes, nail clippers or files with someone who has fungal toenails.  Walking around barefoot in the same room or sleeping in the same bed is unlikely to transfer the organism.  It is important to realize, however, that fungus can spread easily from one nail to the next on the same foot.  HOW DO WE TREAT THIS?  There are several ways to treat this condition.  Treatment may depend on many factors such as age,  medications, pregnancy, liver and kidney conditions, etc.  It is best to ask your doctor which options are available to you.  1. No treatment.   Unlike many other medical concerns, you can live with this condition.  However for many people this can be a painful condition and may lead to ingrown toenails or a bacterial infection.  It is recommended that you keep the nails cut short to help reduce the amount of fungal nail. 2. Topical treatment.  These range from herbal remedies to prescription strength nail lacquers.  About 40-50% effective, topicals require twice daily application for approximately 9 to 12 months or until an entirely new nail has grown out.  The most effective topicals are medical grade medications available through physicians offices. 3. Oral antifungal medications.  With an 80-90% cure rate, the most common oral medication requires 3 to 4 months of therapy and stays in your system for a year as the new nail grows out.  Oral antifungal medications do require blood work to make sure it is a safe drug for you.  A liver function panel will be performed prior to starting the medication and after the first month of treatment.  It is important to have the blood work performed to avoid any harmful side effects.  In general, this medication safe but blood work is required. 4. Laser Therapy.  This treatment is performed by applying a specialized laser to the affected nail plate.  This therapy is noninvasive, fast, and non-painful.  It is not covered by insurance and is therefore, out of pocket.  The results have been very good with a 80-95% cure rate.  [facility] is the only practice in the area to offer this therapy. 5. Permanent Nail Avulsion.  Removing the entire nail so that a new nail will not grow back.

## 2013-11-01 NOTE — Progress Notes (Signed)
   Subjective:    Patient ID: Charles HawkingJames C Ewell, male    DOB: 04-Jan-1961, 53 y.o.   MRN: [id number]  HPI Comments: "I have nail fungus"  Patient c/o of thick, brittle, discolored toenails for years, mainly the 1st bilateral. The left great toe has gotten infected at the medial corner. PCP Rx'd Biaxin, been soaking, using a compression bandage. The area is swollen, red, and draining. He has used Lamisil previously before, but only 1 months worth.       Review of Systems  Musculoskeletal: Positive for back pain.  All other systems reviewed and are negative.       Objective:   Physical Exam GENERAL APPEARANCE: Alert, conversant. Appropriately groomed. No acute distress.  VASCULAR: Pedal pulses palpable at 2/4 DP and PT bilateral.  Capillary refill time is immediate to all digits,  Proximal to distal cooling it warm to warm.  Digital hair growth is present bilateral  NEUROLOGIC: sensation is intact epicritically and protectively to 5.07 monofilament at 5/5 sites bilateral.  Light touch is intact bilateral, vibratory sensation intact bilateral, achilles tendon reflex is intact bilateral.  MUSCULOSKELETAL: acceptable muscle strength, tone and stability bilateral.  Intrinsic muscluature intact bilateral.  Rectus appearance of foot and digits noted bilateral.   DERMATOLOGIC: left hallux nail is inflammed and ingrown on the medial nail border.  No pus or piruence expressed, discomfort with pressure is present.  Bilateral hallux nails are thickened and mycotic in appearance.  Brittleness to the nail plate and discoloration present     Assessment & Plan:  Paronychia medial nail border left hallux with mycotic fungal nail infection  Plan:  Treatment options and alternatives discussed.  Recommended an incision and drainage and patient agreed.  Left hallux was prepped with alcohol and a 1 to 1 mix of 0.5% marcaine plain and 2% lidocaine plain was administered in a digital block fashion.  The toe was  then prepped with betadine solution and exsanguinated.  The offending nail border was then excised and all necrotic tissue was resected.  The area was then cleansed well and antibiotic ointment and a dry sterile dressing was applied.  The patient was dispensed instructions for aftercare.  He has already been on 1 month of Lamisil and check of records show recent labs to be normal.  I will re start him on the lamisil for 3 more months.  I will see him back for a recheck of the medicaiton and the toe in 1 month.

## 2013-11-17 ENCOUNTER — Telehealth: Payer: Self-pay | Admitting: *Deleted

## 2013-11-17 DIAGNOSIS — R7989 Other specified abnormal findings of blood chemistry: Secondary | ICD-10-CM

## 2013-11-17 NOTE — Telephone Encounter (Signed)
Message copied by Regis BillSCATES, Shinichi Anguiano L on Fri Nov 17, 2013 11:54 AM ------      Message from: Marcelline MatesMARTIN, WILLIAM      Created: Mon Nov 06, 2013  8:23 AM       Labs look good overall.  He does have just slightly decreased levels of testosterone.  Since it is so mildly decreased and other labs are normal, I would like to repeat his testosterone level in 2 weeks.  The earlier in the morning he can come to lab, the better -- 8 am if possible.  This will yield the most accurate results.  If still decreased we will initiate testosterone therapy. ------

## 2013-11-24 ENCOUNTER — Encounter: Payer: Self-pay | Admitting: Physician Assistant

## 2013-12-08 ENCOUNTER — Encounter: Payer: Self-pay | Admitting: Family

## 2013-12-08 ENCOUNTER — Ambulatory Visit (INDEPENDENT_AMBULATORY_CARE_PROVIDER_SITE_OTHER): Payer: 59 | Admitting: Family

## 2013-12-08 VITALS — BP 130/80 | HR 86 | Temp 98.5°F | Resp 16 | Ht 72.0 in | Wt 231.0 lb

## 2013-12-08 DIAGNOSIS — G8929 Other chronic pain: Secondary | ICD-10-CM

## 2013-12-08 DIAGNOSIS — J329 Chronic sinusitis, unspecified: Secondary | ICD-10-CM | POA: Insufficient documentation

## 2013-12-08 MED ORDER — AMOXICILLIN 500 MG PO CAPS: 500.0000 mg | ORAL_CAPSULE | Freq: Three times a day (TID) | ORAL | Status: DC

## 2013-12-08 MED ORDER — ALBUTEROL SULFATE HFA 108 (90 BASE) MCG/ACT IN AERS: 2.0000 | INHALATION_SPRAY | Freq: Four times a day (QID) | RESPIRATORY_TRACT | Status: DC | PRN

## 2013-12-08 MED ORDER — BENZONATATE 100 MG PO CAPS: 100.0000 mg | ORAL_CAPSULE | Freq: Three times a day (TID) | ORAL | Status: DC | PRN

## 2013-12-08 MED ORDER — TRAMADOL HCL 50 MG PO TABS: ORAL_TABLET | ORAL | Status: DC

## 2013-12-08 NOTE — Patient Instructions (Addendum)
Add claritin 10mg  once daily for nasal drainage. Start amoxicillin for sinus infection. Use albuterol every 6 hours as needed for cough/wheezing. Use tessalon as needed for cough suppression. Call if symptoms worsen, or if not improved in 3 days.
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
## 2013-12-08 NOTE — Progress Notes (Signed)
Pre visit review using our clinic review tool, if applicable. No additional management support is needed unless otherwise documented below in the visit note. 

## 2013-12-08 NOTE — Assessment & Plan Note (Signed)
Recommended that pt start amoxicillin for sinusitis and early R OM.  Add claritin for nasal congestion.   Tessalon and albuterol PRN for cough.  Follow up if symptoms worsen or if symptoms do not improve.

## 2014-01-09 ENCOUNTER — Encounter: Payer: Self-pay | Admitting: Physician Assistant

## 2014-01-11 ENCOUNTER — Other Ambulatory Visit: Payer: Self-pay | Admitting: Physician Assistant

## 2014-01-11 NOTE — Telephone Encounter (Signed)
Rx request to pharmacy/SLS  

## 2014-01-15 ENCOUNTER — Encounter: Payer: Self-pay | Admitting: Physician Assistant

## 2014-01-15 ENCOUNTER — Telehealth: Payer: Self-pay | Admitting: *Deleted

## 2014-01-15 DIAGNOSIS — G8929 Other chronic pain: Secondary | ICD-10-CM

## 2014-01-15 NOTE — Telephone Encounter (Signed)
Received fax [facility] requesting refill of tramadol. Last Rx given 12/08/13, #90.  Please advise.

## 2014-01-17 MED ORDER — TRAMADOL HCL 50 MG PO TABS: ORAL_TABLET | ORAL | Status: DC

## 2014-01-17 NOTE — Telephone Encounter (Signed)
Refill granted.  Rx printed and ready to be faxed or picked up.

## 2014-01-17 NOTE — Telephone Encounter (Signed)
Rx request faxed to pharmacy/[facility]  

## 2014-02-02 ENCOUNTER — Encounter: Payer: Self-pay | Admitting: Physician Assistant

## 2014-02-07 ENCOUNTER — Other Ambulatory Visit: Payer: Self-pay | Admitting: Physician Assistant

## 2014-02-07 DIAGNOSIS — M549 Dorsalgia, unspecified: Principal | ICD-10-CM

## 2014-02-07 DIAGNOSIS — G8929 Other chronic pain: Secondary | ICD-10-CM

## 2014-02-07 MED ORDER — OXYCODONE-ACETAMINOPHEN 5-325 MG PO TABS: 1.0000 | ORAL_TABLET | Freq: Three times a day (TID) | ORAL | Status: DC | PRN

## 2014-02-09 ENCOUNTER — Encounter: Payer: Self-pay | Admitting: Physician Assistant

## 2014-02-21 ENCOUNTER — Encounter: Payer: Self-pay | Admitting: Physician Assistant

## 2014-02-21 DIAGNOSIS — M5416 Radiculopathy, lumbar region: Secondary | ICD-10-CM

## 2014-02-21 DIAGNOSIS — G8929 Other chronic pain: Secondary | ICD-10-CM

## 2014-02-21 MED ORDER — TRAMADOL HCL 50 MG PO TABS: 100.0000 mg | ORAL_TABLET | Freq: Four times a day (QID) | ORAL | Status: DC | PRN

## 2014-03-12 ENCOUNTER — Encounter: Payer: Self-pay | Admitting: Physician Assistant

## 2014-03-12 ENCOUNTER — Other Ambulatory Visit (HOSPITAL_COMMUNITY): Payer: Self-pay | Admitting: Neurosurgery

## 2014-03-12 DIAGNOSIS — M549 Dorsalgia, unspecified: Secondary | ICD-10-CM

## 2014-03-14 ENCOUNTER — Ambulatory Visit (INDEPENDENT_AMBULATORY_CARE_PROVIDER_SITE_OTHER): Payer: 59 | Admitting: Family

## 2014-03-14 ENCOUNTER — Encounter: Payer: Self-pay | Admitting: Family

## 2014-03-14 VITALS — BP 130/90 | HR 88 | Temp 98.6°F | Ht 72.0 in | Wt 225.0 lb

## 2014-03-14 DIAGNOSIS — M545 Low back pain, unspecified: Secondary | ICD-10-CM

## 2014-03-14 DIAGNOSIS — G8929 Other chronic pain: Secondary | ICD-10-CM

## 2014-03-14 MED ORDER — TRAMADOL HCL 50 MG PO TABS: 100.0000 mg | ORAL_TABLET | Freq: Four times a day (QID) | ORAL | Status: DC | PRN

## 2014-03-14 MED ORDER — METHYLPREDNISOLONE 4 MG PO KIT: PACK | ORAL | Status: DC
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
## 2014-03-14 NOTE — Progress Notes (Signed)
Pre visit review using our clinic review tool, if applicable. No additional management support is needed unless otherwise documented below in the visit note/SLS  

## 2014-03-14 NOTE — Patient Instructions (Signed)
Start Medrol Dose Pak. You may continue to use tramadol as needed. Keep upcoming appointment for MRI and Neurosurgeon.  Call if symptoms worsen or if symptoms do not improve.

## 2014-03-14 NOTE — Assessment & Plan Note (Signed)
Deteriorated. Start Medrol Dose Pak. You may continue to use tramadol as needed. Keep upcoming appointment for MRI and Neurosurgeon.  Call if symptoms worsen or if symptoms do not improve.

## 2014-03-15 ENCOUNTER — Encounter: Payer: Self-pay | Admitting: Physician Assistant

## 2014-03-15 ENCOUNTER — Encounter: Payer: Self-pay | Admitting: Family

## 2014-03-21 ENCOUNTER — Encounter: Payer: Self-pay | Admitting: Physician Assistant

## 2014-03-21 DIAGNOSIS — M5416 Radiculopathy, lumbar region: Secondary | ICD-10-CM

## 2014-03-22 ENCOUNTER — Ambulatory Visit (HOSPITAL_COMMUNITY)
Admission: RE | Admit: 2014-03-22 | Discharge: 2014-03-22 | Disposition: A | Discharge status: Home / Self Care | Payer: 59 | Source: Ambulatory Visit | Attending: Neurosurgery | Admitting: Neurosurgery

## 2014-03-22 ENCOUNTER — Other Ambulatory Visit: Payer: Self-pay | Admitting: Family

## 2014-03-22 DIAGNOSIS — M549 Dorsalgia, unspecified: Secondary | ICD-10-CM

## 2014-03-22 DIAGNOSIS — M545 Low back pain, unspecified: Secondary | ICD-10-CM | POA: Insufficient documentation

## 2014-03-22 DIAGNOSIS — M5126 Other intervertebral disc displacement, lumbar region: Secondary | ICD-10-CM | POA: Insufficient documentation

## 2014-03-22 DIAGNOSIS — M5380 Other specified dorsopathies, site unspecified: Secondary | ICD-10-CM | POA: Insufficient documentation

## 2014-03-22 DIAGNOSIS — M48061 Spinal stenosis, lumbar region without neurogenic claudication: Secondary | ICD-10-CM | POA: Insufficient documentation

## 2014-03-22 DIAGNOSIS — M5146 Schmorl's nodes, lumbar region: Secondary | ICD-10-CM | POA: Insufficient documentation

## 2014-03-22 MED ORDER — GADOBENATE DIMEGLUMINE 529 MG/ML IV SOLN
20.0000 mL | Freq: Once | INTRAVENOUS | Status: AC | PRN
  Administered 2014-03-22: 20 mL via INTRAVENOUS

## 2014-03-23 MED ORDER — METHYLPREDNISOLONE 4 MG PO KIT: PACK | ORAL | Status: DC

## 2014-03-29 ENCOUNTER — Other Ambulatory Visit: Payer: Self-pay | Admitting: Neurosurgery

## 2014-03-29 DIAGNOSIS — M5126 Other intervertebral disc displacement, lumbar region: Secondary | ICD-10-CM

## 2014-04-10 ENCOUNTER — Other Ambulatory Visit: Payer: Self-pay | Admitting: Family

## 2014-04-10 DIAGNOSIS — G8929 Other chronic pain: Secondary | ICD-10-CM

## 2014-04-10 NOTE — Telephone Encounter (Signed)
Last rx given to pt 03/15/14, #60.  Please advise if ok to print Tramadol Rx?

## 2014-04-11 MED ORDER — TRAMADOL HCL 50 MG PO TABS: 100.0000 mg | ORAL_TABLET | Freq: Four times a day (QID) | ORAL | Status: DC | PRN

## 2014-04-11 NOTE — Telephone Encounter (Signed)
Will defer additional refills to his PCP.

## 2014-04-17 ENCOUNTER — Other Ambulatory Visit: Payer: Self-pay | Admitting: Physician Assistant

## 2014-04-17 ENCOUNTER — Telehealth: Payer: Self-pay | Admitting: Family

## 2014-04-17 DIAGNOSIS — G8929 Other chronic pain: Secondary | ICD-10-CM

## 2014-04-17 NOTE — Telephone Encounter (Signed)
Received request for refill of amlodipine.  I have this was last prescribed by Adline MangoPadonda Campbell in 2013.  The only medication I have given him for HTN is losartan.  Please verify this request with patient.  Will need office visit before I will prescribe this.

## 2014-04-18 ENCOUNTER — Ambulatory Visit
Admission: RE | Admit: 2014-04-18 | Discharge: 2014-04-18 | Disposition: A | Discharge status: Home / Self Care | Payer: 59 | Source: Ambulatory Visit | Attending: Neurosurgery | Admitting: Neurosurgery

## 2014-04-18 ENCOUNTER — Encounter: Payer: Self-pay | Admitting: Physician Assistant

## 2014-04-18 VITALS — BP 113/78 | HR 74

## 2014-04-18 DIAGNOSIS — M5126 Other intervertebral disc displacement, lumbar region: Secondary | ICD-10-CM

## 2014-04-18 MED ORDER — IOHEXOL 180 MG/ML  SOLN
1.0000 mL | Freq: Once | INTRAMUSCULAR | Status: AC | PRN
  Administered 2014-04-18: 1 mL via EPIDURAL

## 2014-04-18 MED ORDER — METHYLPREDNISOLONE ACETATE 40 MG/ML INJ SUSP (RADIOLOG
120.0000 mg | Freq: Once | INTRAMUSCULAR | Status: AC
  Administered 2014-04-18: 120 mg via EPIDURAL

## 2014-04-18 MED ORDER — LOSARTAN POTASSIUM 25 MG PO TABS: ORAL_TABLET | ORAL | Status: DC

## 2014-04-18 MED ORDER — FENOFIBRATE 48 MG PO TABS: ORAL_TABLET | ORAL | Status: DC

## 2014-04-18 MED ORDER — AMLODIPINE BESYLATE 10 MG PO TABS: ORAL_TABLET | ORAL | Status: DC

## 2014-04-18 NOTE — Discharge Instructions (Signed)

## 2014-04-18 NOTE — Telephone Encounter (Signed)
Patient came in to office with daughter Charles Jordan[had appt] and discussed with provider; previous provider Dr. Junious DresserStephen Campbell has continued to fill this medication for patient up until this point. Patient is to schedule appointment for CPE prior to leaving office today, as discussed with PCP, to have refills authorized. Rx request to pharmacy/[facility]

## 2014-04-18 NOTE — Telephone Encounter (Signed)
Rx request for Fenofibrate & Losartan, per MyChart note to pharmacy/SLS

## 2014-04-23 NOTE — Telephone Encounter (Signed)
Request completed on 07.22.15, pt in office on 07.29.15 when in office with daughter/SLS

## 2014-04-23 NOTE — Telephone Encounter (Signed)
Request completed on 07.29.15, pt informed when in office with daughter/SLS

## 2014-04-25 ENCOUNTER — Ambulatory Visit (INDEPENDENT_AMBULATORY_CARE_PROVIDER_SITE_OTHER): Payer: 59 | Admitting: Physician Assistant

## 2014-04-25 ENCOUNTER — Encounter: Payer: Self-pay | Admitting: Physician Assistant

## 2014-04-25 VITALS — BP 110/80 | HR 70 | Temp 98.4°F | Resp 16 | Ht 72.0 in | Wt 217.8 lb

## 2014-04-25 DIAGNOSIS — G8929 Other chronic pain: Secondary | ICD-10-CM | POA: Insufficient documentation

## 2014-04-25 DIAGNOSIS — Z0000 Encounter for general adult medical examination without abnormal findings: Secondary | ICD-10-CM

## 2014-04-25 DIAGNOSIS — M545 Low back pain, unspecified: Secondary | ICD-10-CM

## 2014-04-25 DIAGNOSIS — I10 Essential (primary) hypertension: Secondary | ICD-10-CM

## 2014-04-25 DIAGNOSIS — E781 Pure hyperglyceridemia: Secondary | ICD-10-CM

## 2014-04-25 DIAGNOSIS — Z1211 Encounter for screening for malignant neoplasm of colon: Secondary | ICD-10-CM

## 2014-04-25 DIAGNOSIS — Z125 Encounter for screening for malignant neoplasm of prostate: Secondary | ICD-10-CM | POA: Insufficient documentation

## 2014-04-25 LAB — HEPATIC FUNCTION PANEL
ALBUMIN: 4.5 g/dL (ref 3.5–5.2)
ALT: 50 U/L (ref 0–53)
AST: 19 U/L (ref 0–37)
Alkaline Phosphatase: 81 U/L (ref 39–117)
BILIRUBIN INDIRECT: 0.5 mg/dL (ref 0.2–1.2)
Bilirubin, Direct: 0.1 mg/dL (ref 0.0–0.3)
TOTAL PROTEIN: 6.9 g/dL (ref 6.0–8.3)
Total Bilirubin: 0.6 mg/dL (ref 0.2–1.2)

## 2014-04-25 LAB — BASIC METABOLIC PANEL
BUN: 20 mg/dL (ref 6–23)
CHLORIDE: 102 meq/L (ref 96–112)
CO2: 26 meq/L (ref 19–32)
CREATININE: 0.88 mg/dL (ref 0.50–1.35)
Calcium: 9.5 mg/dL (ref 8.4–10.5)
GLUCOSE: 97 mg/dL (ref 70–99)
Potassium: 4.2 mEq/L (ref 3.5–5.3)
Sodium: 139 mEq/L (ref 135–145)

## 2014-04-25 LAB — CBC
HCT: 41.8 % (ref 39.0–52.0)
HEMOGLOBIN: 14.9 g/dL (ref 13.0–17.0)
MCH: 30.7 pg (ref 26.0–34.0)
MCHC: 35.6 g/dL (ref 30.0–36.0)
MCV: 86 fL (ref 78.0–100.0)
Platelets: 311 10*3/uL (ref 150–400)
RBC: 4.86 MIL/uL (ref 4.22–5.81)
RDW: 12.8 % (ref 11.5–15.5)
WBC: 8 10*3/uL (ref 4.0–10.5)

## 2014-04-25 LAB — LIPID PANEL
Cholesterol: 197 mg/dL (ref 0–200)
HDL: 49 mg/dL (ref >39)
LDL Cholesterol: 116 mg/dL — ABNORMAL HIGH (ref 0–99)
TRIGLYCERIDES: 160 mg/dL — AB (ref <150)
Total CHOL/HDL Ratio: 4 Ratio
VLDL: 32 mg/dL (ref 0–40)

## 2014-04-25 LAB — HEMOGLOBIN A1C
HEMOGLOBIN A1C: 6.2 % — AB (ref <5.7)
Mean Plasma Glucose: 131 mg/dL — ABNORMAL HIGH (ref <117)

## 2014-04-25 MED ORDER — HYDROCODONE-ACETAMINOPHEN 5-325 MG PO TABS: 1.0000 | ORAL_TABLET | Freq: Three times a day (TID) | ORAL | Status: DC | PRN

## 2014-04-25 MED ORDER — PREGABALIN 50 MG PO CAPS: 50.0000 mg | ORAL_CAPSULE | Freq: Two times a day (BID) | ORAL | Status: DC
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
## 2014-04-25 NOTE — Assessment & Plan Note (Signed)
History reviewed and updated.  Immunizations up-to-date.  Referral placed to GI for screening colonoscopy. Will obtain fasting labs at today's visit.

## 2014-04-25 NOTE — Assessment & Plan Note (Signed)
STOP tramadol. Rx Vicodin for breakthrough pain.  Will begin trial of Lyrica.  Follow-up with myself in 2 weeks.  Follow-up with Neurosurgery as scheduled.

## 2014-04-25 NOTE — Patient Instructions (Signed)
Please continue medications as directed.  Obtain labs.  I will call you with your results. You will be contacted for a screening colonoscopy.  Begin taking Lyrica -- one tablet twice daily.  Follow-up with me in 2-3 weeks.  Hopefully this will help your pain.   Preventive Care for Adults A healthy lifestyle and preventive care can promote health and wellness. Preventive health guidelines for men include the following key practices:  A routine yearly physical is a good way to check with your health care provider about your health and preventative screening. It is a chance to share any concerns and updates on your health and to receive a thorough exam.  Visit your dentist for a routine exam and preventative care every 6 months. Brush your teeth twice a day and floss once a day. Good oral hygiene prevents tooth decay and gum disease.  The frequency of eye exams is based on your age, health, family medical history, use of contact lenses, and other factors. Follow your health care provider's recommendations for frequency of eye exams.  Eat a healthy diet. Foods such as vegetables, fruits, whole grains, low-fat dairy products, and lean protein foods contain the nutrients you need without too many calories. Decrease your intake of foods high in solid fats, added sugars, and salt. Eat the right amount of calories for you.Get information about a proper diet from your health care provider, if necessary.  Regular physical exercise is one of the most important things you can do for your health. Most adults should get at least 150 minutes of moderate-intensity exercise (any activity that increases your heart rate and causes you to sweat) each week. In addition, most adults need muscle-strengthening exercises on 2 or more days a week.  Maintain a healthy weight. The body mass index (BMI) is a screening tool to identify possible weight problems. It provides an estimate of body fat based on height and weight. Your  health care provider can find your BMI and can help you achieve or maintain a healthy weight.For adults 20 years and older:  A BMI below 18.5 is considered underweight.  A BMI of 18.5 to 24.9 is normal.  A BMI of 25 to 29.9 is considered overweight.  A BMI of 30 and above is considered obese.  Maintain normal blood lipids and cholesterol levels by exercising and minimizing your intake of saturated fat. Eat a balanced diet with plenty of fruit and vegetables. Blood tests for lipids and cholesterol should begin at age 40 and be repeated every 5 years. If your lipid or cholesterol levels are high, you are over 50, or you are at high risk for heart disease, you may need your cholesterol levels checked more frequently.Ongoing high lipid and cholesterol levels should be treated with medicines if diet and exercise are not working.  If you smoke, find out from your health care provider how to quit. If you do not use tobacco, do not start.  Lung cancer screening is recommended for adults aged 55-80 years who are at high risk for developing lung cancer because of a history of smoking. A yearly low-dose CT scan of the lungs is recommended for people who have at least a 30-pack-year history of smoking and are a current smoker or have quit within the past 15 years. A pack year of smoking is smoking an average of 1 pack of cigarettes a day for 1 year (for example: 1 pack a day for 30 years or 2 packs a day for 15  years). Yearly screening should continue until the smoker has stopped smoking for at least 15 years. Yearly screening should be stopped for people who develop a health problem that would prevent them from having lung cancer treatment.  If you choose to drink alcohol, do not have more than 2 drinks per day. One drink is considered to be 12 ounces (355 mL) of beer, 5 ounces (148 mL) of wine, or 1.5 ounces (44 mL) of liquor.  Avoid use of street drugs. Do not share needles with anyone. Ask for help if  you need support or instructions about stopping the use of drugs.  High blood pressure causes heart disease and increases the risk of stroke. Your blood pressure should be checked at least every 1-2 years. Ongoing high blood pressure should be treated with medicines, if weight loss and exercise are not effective.  If you are 81-32 years old, ask your health care provider if you should take aspirin to prevent heart disease.  Diabetes screening involves taking a blood sample to check your fasting blood sugar level. This should be done once every 3 years, after age 94, if you are within normal weight and without risk factors for diabetes. Testing should be considered at a younger age or be carried out more frequently if you are overweight and have at least 1 risk factor for diabetes.  Colorectal cancer can be detected and often prevented. Most routine colorectal cancer screening begins at the age of 54 and continues through age 66. However, your health care provider may recommend screening at an earlier age if you have risk factors for colon cancer. On a yearly basis, your health care provider may provide home test kits to check for hidden blood in the stool. Use of a small camera at the end of a tube to directly examine the colon (sigmoidoscopy or colonoscopy) can detect the earliest forms of colorectal cancer. Talk to your health care provider about this at age 51, when routine screening begins. Direct exam of the colon should be repeated every 5-10 years through age 34, unless early forms of precancerous polyps or small growths are found.  People who are at an increased risk for hepatitis B should be screened for this virus. You are considered at high risk for hepatitis B if:  You were born in a country where hepatitis B occurs often. Talk with your health care provider about which countries are considered high risk.  Your parents were born in a high-risk country and you have not received a shot to  protect against hepatitis B (hepatitis B vaccine).  You have HIV or AIDS.  You use needles to inject street drugs.  You live with, or have sex with, someone who has hepatitis B.  You are a man who has sex with other men (MSM).  You get hemodialysis treatment.  You take certain medicines for conditions such as cancer, organ transplantation, and autoimmune conditions.  Hepatitis C blood testing is recommended for all people born from [birth year] through 1965 and any individual with known risks for hepatitis C.  Practice safe sex. Use condoms and avoid high-risk sexual practices to reduce the spread of sexually transmitted infections (STIs). STIs include gonorrhea, chlamydia, syphilis, trichomonas, herpes, HPV, and human immunodeficiency virus (HIV). Herpes, HIV, and HPV are viral illnesses that have no cure. They can result in disability, cancer, and death.  If you are at risk of being infected with HIV, it is recommended that you take a prescription medicine daily to  prevent HIV infection. This is called preexposure prophylaxis (PrEP). You are considered at risk if:  You are a man who has sex with other men (MSM) and have other risk factors.  You are a heterosexual man, are sexually active, and are at increased risk for HIV infection.  You take drugs by injection.  You are sexually active with a partner who has HIV.  Talk with your health care provider about whether you are at high risk of being infected with HIV. If you choose to begin PrEP, you should first be tested for HIV. You should then be tested every 3 months for as long as you are taking PrEP.  A one-time screening for abdominal aortic aneurysm (AAA) and surgical repair of large AAAs by ultrasound are recommended for men ages 26 to 68 years who are current or former smokers.  Healthy men should no longer receive prostate-specific antigen (PSA) blood tests as part of routine cancer screening. Talk with your health care provider about  prostate cancer screening.  Testicular cancer screening is not recommended for adult males who have no symptoms. Screening includes self-exam, a health care provider exam, and other screening tests. Consult with your health care provider about any symptoms you have or any concerns you have about testicular cancer.  Use sunscreen. Apply sunscreen liberally and repeatedly throughout the day. You should seek shade when your shadow is shorter than you. Protect yourself by wearing long sleeves, pants, a wide-brimmed hat, and sunglasses year round, whenever you are outdoors.  Once a month, do a whole-body skin exam, using a mirror to look at the skin on your back. Tell your health care provider about new moles, moles that have irregular borders, moles that are larger than a pencil eraser, or moles that have changed in shape or color.  Stay current with required vaccines (immunizations).  Influenza vaccine. All adults should be immunized every year.  Tetanus, diphtheria, and acellular pertussis (Td, Tdap) vaccine. An adult who has not previously received Tdap or who does not know his vaccine status should receive 1 dose of Tdap. This initial dose should be followed by tetanus and diphtheria toxoids (Td) booster doses every 10 years. Adults with an unknown or incomplete history of completing a 3-dose immunization series with Td-containing vaccines should begin or complete a primary immunization series including a Tdap dose. Adults should receive a Td booster every 10 years.  Varicella vaccine. An adult without evidence of immunity to varicella should receive 2 doses or a second dose if he has previously received 1 dose.  Human papillomavirus (HPV) vaccine. Males aged 63-21 years who have not received the vaccine previously should receive the 3-dose series. Males aged 22-26 years may be immunized. Immunization is recommended through the age of 9 years for any male who has sex with males and did not get any  or all doses earlier. Immunization is recommended for any person with an immunocompromised condition through the age of 54 years if he did not get any or all doses earlier. During the 3-dose series, the second dose should be obtained 4-8 weeks after the first dose. The third dose should be obtained 24 weeks after the first dose and 16 weeks after the second dose.  Zoster vaccine. One dose is recommended for adults aged 45 years or older unless certain conditions are present.  Measles, mumps, and rubella (MMR) vaccine. Adults born before [birth year] generally are considered immune to measles and mumps. Adults born in [birth year] or later should have  1 or more doses of MMR vaccine unless there is a contraindication to the vaccine or there is laboratory evidence of immunity to each of the three diseases. A routine second dose of MMR vaccine should be obtained at least 28 days after the first dose for students attending postsecondary schools, health care workers, or international travelers. People who received inactivated measles vaccine or an unknown type of measles vaccine during 1963-1967 should receive 2 doses of MMR vaccine. People who received inactivated mumps vaccine or an unknown type of mumps vaccine before 1979 and are at high risk for mumps infection should consider immunization with 2 doses of MMR vaccine. Unvaccinated health care workers born before [birth year] who lack laboratory evidence of measles, mumps, or rubella immunity or laboratory confirmation of disease should consider measles and mumps immunization with 2 doses of MMR vaccine or rubella immunization with 1 dose of MMR vaccine.  Pneumococcal 13-valent conjugate (PCV13) vaccine. When indicated, a person who is uncertain of his immunization history and has no record of immunization should receive the PCV13 vaccine. An adult aged 26 years or older who has certain medical conditions and has not been previously immunized should receive 1 dose of PCV13 vaccine.  This PCV13 should be followed with a dose of pneumococcal polysaccharide (PPSV23) vaccine. The PPSV23 vaccine dose should be obtained at least 8 weeks after the dose of PCV13 vaccine. An adult aged 64 years or older who has certain medical conditions and previously received 1 or more doses of PPSV23 vaccine should receive 1 dose of PCV13. The PCV13 vaccine dose should be obtained 1 or more years after the last PPSV23 vaccine dose.  Pneumococcal polysaccharide (PPSV23) vaccine. When PCV13 is also indicated, PCV13 should be obtained first. All adults aged 36 years and older should be immunized. An adult younger than age 32 years who has certain medical conditions should be immunized. Any person who resides in a nursing home or long-term care facility should be immunized. An adult smoker should be immunized. People with an immunocompromised condition and certain other conditions should receive both PCV13 and PPSV23 vaccines. People with human immunodeficiency virus (HIV) infection should be immunized as soon as possible after diagnosis. Immunization during chemotherapy or radiation therapy should be avoided. Routine use of PPSV23 vaccine is not recommended for American Indians, [place] Natives, or people younger than 65 years unless there are medical conditions that require PPSV23 vaccine. When indicated, people who have unknown immunization and have no record of immunization should receive PPSV23 vaccine. One-time revaccination 5 years after the first dose of PPSV23 is recommended for people aged 19-64 years who have chronic kidney failure, nephrotic syndrome, asplenia, or immunocompromised conditions. People who received 1-2 doses of PPSV23 before age 18 years should receive another dose of PPSV23 vaccine at age 26 years or later if at least 5 years have passed since the previous dose. Doses of PPSV23 are not needed for people immunized with PPSV23 at or after age 80 years.  Meningococcal vaccine. Adults with  asplenia or persistent complement component deficiencies should receive 2 doses of quadrivalent meningococcal conjugate (MenACWY-D) vaccine. The doses should be obtained at least 2 months apart. Microbiologists working with certain meningococcal bacteria, [place] recruits, people at risk during an outbreak, and people who travel to or live in countries with a high rate of meningitis should be immunized. A first-year college student up through age 79 years who is living in a residence hall should receive a dose if he did not receive a  dose on or after his 16th birthday. Adults who have certain high-risk conditions should receive one or more doses of vaccine.  Hepatitis A vaccine. Adults who wish to be protected from this disease, have certain high-risk conditions, work with hepatitis A-infected animals, work in hepatitis A research labs, or travel to or work in countries with a high rate of hepatitis A should be immunized. Adults who were previously unvaccinated and who anticipate close contact with an international adoptee during the first 60 days after arrival in the [place] from a country with a high rate of hepatitis A should be immunized.  Hepatitis B vaccine. Adults should be immunized if they wish to be protected from this disease, have certain high-risk conditions, may be exposed to blood or other infectious body fluids, are household contacts or sex partners of hepatitis B positive people, are clients or workers in certain care facilities, or travel to or work in countries with a high rate of hepatitis B.  Haemophilus influenzae type b (Hib) vaccine. A previously unvaccinated person with asplenia or sickle cell disease or having a scheduled splenectomy should receive 1 dose of Hib vaccine. Regardless of previous immunization, a recipient of a hematopoietic stem cell transplant should receive a 3-dose series 6-12 months after his successful transplant. Hib vaccine is not recommended for adults  with HIV infection. Preventive Service / Frequency Ages 72 to 70  Blood pressure check.** / Every 1 to 2 years.  Lipid and cholesterol check.** / Every 5 years beginning at age 60.  Hepatitis C blood test.** / For any individual with known risks for hepatitis C.  Skin self-exam. / Monthly.  Influenza vaccine. / Every year.  Tetanus, diphtheria, and acellular pertussis (Tdap, Td) vaccine.** / Consult your health care provider. 1 dose of Td every 10 years.  Varicella vaccine.** / Consult your health care provider.  HPV vaccine. / 3 doses over 6 months, if 69 or younger.  Measles, mumps, rubella (MMR) vaccine.** / You need at least 1 dose of MMR if you were born in 1957 or later. You may also need a second dose.  Pneumococcal 13-valent conjugate (PCV13) vaccine.** / Consult your health care provider.  Pneumococcal polysaccharide (PPSV23) vaccine.** / 1 to 2 doses if you smoke cigarettes or if you have certain conditions.  Meningococcal vaccine.** / 1 dose if you are age 56 to 39 years and a Market researcher living in a residence hall, or have one of several medical conditions. You may also need additional booster doses.  Hepatitis A vaccine.** / Consult your health care provider.  Hepatitis B vaccine.** / Consult your health care provider.  Haemophilus influenzae type b (Hib) vaccine.** / Consult your health care provider. Ages 67 to 24  Blood pressure check.** / Every 1 to 2 years.  Lipid and cholesterol check.** / Every 5 years beginning at age 78.  Lung cancer screening. / Every year if you are aged 35-80 years and have a 30-pack-year history of smoking and currently smoke or have quit within the past 15 years. Yearly screening is stopped once you have quit smoking for at least 15 years or develop a health problem that would prevent you from having lung cancer treatment.  Fecal occult blood test (FOBT) of stool. / Every year beginning at age 68 and continuing until  age 3. You may not have to do this test if you get a colonoscopy every 10 years.  Flexible sigmoidoscopy** or colonoscopy.** / Every 5 years for a flexible  sigmoidoscopy or every 10 years for a colonoscopy beginning at age 21 and continuing until age 68.  Hepatitis C blood test.** / For all people born from [birth year] through 1964 and any individual with known risks for hepatitis C.  Skin self-exam. / Monthly.  Influenza vaccine. / Every year.  Tetanus, diphtheria, and acellular pertussis (Tdap/Td) vaccine.** / Consult your health care provider. 1 dose of Td every 10 years.  Varicella vaccine.** / Consult your health care provider.  Zoster vaccine.** / 1 dose for adults aged 30 years or older.  Measles, mumps, rubella (MMR) vaccine.** / You need at least 1 dose of MMR if you were born in 1956 or later. You may also need a second dose.  Pneumococcal 13-valent conjugate (PCV13) vaccine.** / Consult your health care provider.  Pneumococcal polysaccharide (PPSV23) vaccine.** / 1 to 2 doses if you smoke cigarettes or if you have certain conditions.  Meningococcal vaccine.** / Consult your health care provider.  Hepatitis A vaccine.** / Consult your health care provider.  Hepatitis B vaccine.** / Consult your health care provider.  Haemophilus influenzae type b (Hib) vaccine.** / Consult your health care provider. Ages 55 and over  Blood pressure check.** / Every 1 to 2 years.  Lipid and cholesterol check.**/ Every 5 years beginning at age 27.  Lung cancer screening. / Every year if you are aged 35-80 years and have a 30-pack-year history of smoking and currently smoke or have quit within the past 15 years. Yearly screening is stopped once you have quit smoking for at least 15 years or develop a health problem that would prevent you from having lung cancer treatment.  Fecal occult blood test (FOBT) of stool. / Every year beginning at age 64 and continuing until age 106. You may not have to  do this test if you get a colonoscopy every 10 years.  Flexible sigmoidoscopy** or colonoscopy.** / Every 5 years for a flexible sigmoidoscopy or every 10 years for a colonoscopy beginning at age 23 and continuing until age 61.  Hepatitis C blood test.** / For all people born from [birth year] through 1964 and any individual with known risks for hepatitis C.  Abdominal aortic aneurysm (AAA) screening.** / A one-time screening for ages 24 to 91 years who are current or former smokers.  Skin self-exam. / Monthly.  Influenza vaccine. / Every year.  Tetanus, diphtheria, and acellular pertussis (Tdap/Td) vaccine.** / 1 dose of Td every 10 years.  Varicella vaccine.** / Consult your health care provider.  Zoster vaccine.** / 1 dose for adults aged 42 years or older.  Pneumococcal 13-valent conjugate (PCV13) vaccine.** / Consult your health care provider.  Pneumococcal polysaccharide (PPSV23) vaccine.** / 1 dose for all adults aged 2 years and older.  Meningococcal vaccine.** / Consult your health care provider.  Hepatitis A vaccine.** / Consult your health care provider.  Hepatitis B vaccine.** / Consult your health care provider.  Haemophilus influenzae type b (Hib) vaccine.** / Consult your health care provider. **Family history and personal history of risk and conditions may change your health care provider's recommendations. Document Released: 11/03/2001 Document Revised: 09/12/2013 Document Reviewed: 02/02/2011 [facility] Patient Information 2015 [place], [place]. This information is not intended to replace advice given to you by your health care provider. Make sure you discuss any questions you have with your health care provider.

## 2014-04-25 NOTE — Assessment & Plan Note (Signed)
Will obtain PSA

## 2014-04-25 NOTE — Assessment & Plan Note (Signed)
Continue current regimen.  Will obtain fasting lipid panel today.

## 2014-04-25 NOTE — Progress Notes (Signed)
Pre visit review using our clinic review tool, if applicable. No additional management support is needed unless otherwise documented below in the visit note/SLS  

## 2014-04-25 NOTE — Assessment & Plan Note (Signed)
Well-controlled.  Continue current regimen. 

## 2014-04-26 ENCOUNTER — Telehealth: Payer: Self-pay | Admitting: Physician Assistant

## 2014-04-26 LAB — URINALYSIS, ROUTINE W REFLEX MICROSCOPIC
BILIRUBIN URINE: NEGATIVE
Glucose, UA: NEGATIVE mg/dL
Hgb urine dipstick: NEGATIVE
Ketones, ur: NEGATIVE mg/dL
Leukocytes, UA: NEGATIVE
Nitrite: NEGATIVE
Protein, ur: NEGATIVE mg/dL
SPECIFIC GRAVITY, URINE: 1.024 (ref 1.005–1.030)
Urobilinogen, UA: 0.2 mg/dL (ref 0.0–1.0)
pH: 5 (ref 5.0–8.0)

## 2014-04-26 LAB — PSA: PSA: 0.6 ng/mL (ref <=4.00)

## 2014-04-26 LAB — TSH: TSH: 1.522 u[IU]/mL (ref 0.350–4.500)

## 2014-04-26 NOTE — Telephone Encounter (Signed)
Relevant patient education assigned to patient using Emmi. ° °

## 2014-05-18 ENCOUNTER — Ambulatory Visit (INDEPENDENT_AMBULATORY_CARE_PROVIDER_SITE_OTHER): Payer: 59 | Admitting: Physician Assistant

## 2014-05-18 ENCOUNTER — Encounter: Payer: Self-pay | Admitting: Physician Assistant

## 2014-05-18 ENCOUNTER — Telehealth: Payer: Self-pay | Admitting: Physician Assistant

## 2014-05-18 VITALS — BP 120/84 | HR 68 | Temp 98.0°F | Resp 16 | Ht 72.0 in | Wt 219.2 lb

## 2014-05-18 DIAGNOSIS — M545 Low back pain, unspecified: Secondary | ICD-10-CM

## 2014-05-18 DIAGNOSIS — G8929 Other chronic pain: Secondary | ICD-10-CM

## 2014-05-18 MED ORDER — PREGABALIN 75 MG PO CAPS: 75.0000 mg | ORAL_CAPSULE | Freq: Two times a day (BID) | ORAL | Status: DC

## 2014-05-18 MED ORDER — AMLODIPINE BESYLATE 10 MG PO TABS: ORAL_TABLET | ORAL | Status: DC

## 2014-05-18 NOTE — Patient Instructions (Signed)
Please increase your Lyrica to 75 mg twice daily.  Continue exercises.  Continue Tramadol but try to wean yourself off of this medication.  Follow-up with me in 3 months.

## 2014-05-18 NOTE — Telephone Encounter (Signed)
Patient is requesting a copy of immunization record and last TB inj. This is for employment
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
## 2014-05-18 NOTE — Assessment & Plan Note (Signed)
Increase Lyrica to 75 mg BID.  Voucher given for reduced-cost of medication. Will begin weaning off of Tramadol.  Follow-up in 3 months.  Return sooner as needed.

## 2014-05-18 NOTE — Progress Notes (Signed)
Pre visit review using our clinic review tool, if applicable. No additional management support is needed unless otherwise documented below in the visit note/SLS  

## 2014-05-24 NOTE — Telephone Encounter (Signed)
Our records for him have been printed and will be waiting in an envelop at front desk for pick up.  It includes flu shots and labs tetanus.  No TB testing on file (not injections.  I think you meant Td)

## 2014-05-24 NOTE — Telephone Encounter (Signed)
Informed patient of this.  °

## 2014-05-29 ENCOUNTER — Encounter: Payer: Self-pay | Admitting: Physician Assistant

## 2014-06-03 ENCOUNTER — Other Ambulatory Visit: Payer: Self-pay | Admitting: Physician Assistant

## 2014-06-03 ENCOUNTER — Encounter: Payer: Self-pay | Admitting: Physician Assistant

## 2014-06-04 MED ORDER — TRAMADOL HCL 50 MG PO TABS: 50.0000 mg | ORAL_TABLET | Freq: Three times a day (TID) | ORAL | Status: AC | PRN

## 2014-06-05 ENCOUNTER — Other Ambulatory Visit: Payer: Self-pay | Admitting: Physician Assistant

## 2014-06-05 DIAGNOSIS — G8929 Other chronic pain: Secondary | ICD-10-CM

## 2014-06-05 DIAGNOSIS — M545 Low back pain: Principal | ICD-10-CM

## 2014-06-05 MED ORDER — HYDROCODONE-ACETAMINOPHEN 5-325 MG PO TABS: 1.0000 | ORAL_TABLET | Freq: Three times a day (TID) | ORAL | Status: DC | PRN

## 2014-07-06 ENCOUNTER — Other Ambulatory Visit: Payer: Self-pay | Admitting: Physician Assistant

## 2014-07-18 MED ORDER — OMEPRAZOLE 20 MG PO CPDR: 20.0000 mg | DELAYED_RELEASE_CAPSULE | Freq: Every day | ORAL | Status: DC | PRN

## 2014-07-31 ENCOUNTER — Encounter: Payer: Self-pay | Admitting: Physician Assistant

## 2014-07-31 ENCOUNTER — Other Ambulatory Visit: Payer: Self-pay | Admitting: Physician Assistant

## 2014-08-01 ENCOUNTER — Encounter: Payer: Self-pay | Admitting: Physician Assistant

## 2014-08-02 ENCOUNTER — Other Ambulatory Visit: Payer: Self-pay | Admitting: Neurosurgery

## 2014-08-02 ENCOUNTER — Other Ambulatory Visit: Payer: Self-pay | Admitting: Physician Assistant

## 2014-08-02 ENCOUNTER — Ambulatory Visit
Admission: RE | Admit: 2014-08-02 | Discharge: 2014-08-02 | Disposition: A | Discharge status: Home / Self Care | Payer: 59 | Source: Ambulatory Visit | Attending: Neurosurgery | Admitting: Neurosurgery

## 2014-08-02 DIAGNOSIS — M5126 Other intervertebral disc displacement, lumbar region: Secondary | ICD-10-CM

## 2014-08-02 MED ORDER — IOHEXOL 180 MG/ML  SOLN
1.0000 mL | Freq: Once | INTRAMUSCULAR | Status: AC | PRN
  Administered 2014-08-02: 1 mL via EPIDURAL

## 2014-08-02 MED ORDER — METHYLPREDNISOLONE ACETATE 40 MG/ML INJ SUSP (RADIOLOG
120.0000 mg | Freq: Once | INTRAMUSCULAR | Status: AC
  Administered 2014-08-02: 120 mg via EPIDURAL

## 2014-08-02 NOTE — Discharge Instructions (Signed)

## 2014-08-06 NOTE — Telephone Encounter (Signed)
Rx request to pharmacy/SLS  

## 2014-08-18 ENCOUNTER — Other Ambulatory Visit: Payer: Self-pay | Admitting: Physician Assistant

## 2014-08-18 DIAGNOSIS — G8929 Other chronic pain: Secondary | ICD-10-CM

## 2014-08-18 DIAGNOSIS — M545 Low back pain, unspecified: Secondary | ICD-10-CM

## 2014-08-20 MED ORDER — HYDROCODONE-ACETAMINOPHEN 5-325 MG PO TABS: 1.0000 | ORAL_TABLET | Freq: Three times a day (TID) | ORAL | Status: DC | PRN

## 2014-08-20 NOTE — Telephone Encounter (Signed)
One month refill of Norco granted.  Per office policy, patient needs to sign CSC and provide sample for UDS at time of medication pickup.  No further refills can be given until those things occur.

## 2014-08-22 ENCOUNTER — Encounter: Payer: Self-pay | Admitting: Physician Assistant

## 2014-09-06 ENCOUNTER — Encounter: Payer: Self-pay | Admitting: Physician Assistant

## 2014-10-01 ENCOUNTER — Other Ambulatory Visit: Payer: Self-pay | Admitting: Physician Assistant

## 2014-10-01 ENCOUNTER — Encounter: Payer: Self-pay | Admitting: Physician Assistant

## 2014-10-01 MED ORDER — TRAMADOL HCL 50 MG PO TABS: 50.0000 mg | ORAL_TABLET | Freq: Two times a day (BID) | ORAL | Status: DC | PRN

## 2014-10-09 ENCOUNTER — Encounter: Payer: Self-pay | Admitting: Physician Assistant

## 2014-10-09 ENCOUNTER — Ambulatory Visit (INDEPENDENT_AMBULATORY_CARE_PROVIDER_SITE_OTHER): Payer: 59 | Admitting: Physician Assistant

## 2014-10-09 VITALS — BP 145/92 | HR 79 | Temp 98.2°F | Resp 16 | Ht 72.0 in | Wt 231.0 lb

## 2014-10-09 DIAGNOSIS — M545 Low back pain: Secondary | ICD-10-CM

## 2014-10-09 DIAGNOSIS — G8929 Other chronic pain: Secondary | ICD-10-CM

## 2014-10-09 MED ORDER — HYDROCODONE-ACETAMINOPHEN 5-325 MG PO TABS: 1.0000 | ORAL_TABLET | Freq: Three times a day (TID) | ORAL | Status: DC | PRN

## 2014-10-09 NOTE — Patient Instructions (Signed)
Please take medications as directed.  Follow-up with Dr. Gerlene FeeKritzer.  Have FMLA paperwork sent to my office.
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
## 2014-10-09 NOTE — Progress Notes (Signed)
Pre visit review using our clinic review tool, if applicable. No additional management support is needed unless otherwise documented below in the visit note/SLS  

## 2014-10-10 NOTE — Assessment & Plan Note (Signed)
Meds refilled.  Patient instructed to follow-up with Neurosurgery as scheduled.  Fax over paperwork for completion of FMLA.

## 2014-10-11 ENCOUNTER — Telehealth: Payer: Self-pay | Admitting: *Deleted

## 2014-10-11 NOTE — Telephone Encounter (Signed)
Received FMLA paperwork via fax from [employer]. Forms filled out as much as possible and forwarded to [place]. JG//CMA

## 2014-10-13 ENCOUNTER — Telehealth: Payer: 59 | Admitting: Nurse Practitioner

## 2014-10-13 DIAGNOSIS — A084 Viral intestinal infection, unspecified: Secondary | ICD-10-CM

## 2014-10-13 MED ORDER — ONDANSETRON HCL 4 MG PO TABS: 4.0000 mg | ORAL_TABLET | Freq: Three times a day (TID) | ORAL | Status: DC | PRN

## 2014-10-13 NOTE — Progress Notes (Signed)
We are so sorry you are not feeling well. Here is what we plan to do.  It sounds like with vomiting you described that you have viral gastroenteritis. Unfortunately it has to run its course and there is  Not much we can do. It usually lasts about 24 hours and may go through  everyone in your household. Here are some things you can do at home.  First 24 Hours-Clear liquids  popsicles  Jello  gatorade  Sprite Second 24 hours-Add Full liquids ( Liquids you cant see through) Third 24 hours- Bland diet ( foods that are baked or broiled)  *avoiding fried foods and highly spiced foods* During these 3 days  Avoid milk, cheese, ice cream or any other dairy products  Avoid caffeine- REMEMBER Mt. Dew and Mello Yellow contain lots of caffeine You should eat and drink in  Frequent small volumes If no improvement in symptoms or worsen in 2-3 days should RETRUN TO OFFICE or go to ER!

## 2014-10-13 NOTE — Addendum Note (Signed)
Addended by: Bennie PieriniMARTIN, MARY-MARGARET on: 10/13/2014 03:52 PM   Modules accepted: Orders

## 2014-10-15 ENCOUNTER — Encounter: Payer: Self-pay | Admitting: Physician Assistant

## 2014-10-15 NOTE — Telephone Encounter (Signed)
Did we receive his FMLA paperwork?  I don't remember completing it.  If it is at the office, let me know and I will try to drop by office to complete.

## 2014-10-16 NOTE — Telephone Encounter (Signed)
Yes, I can meet you this afternoon if you would rather than coming into the office [as [place]]; let me know how you would like to handle this/SLS

## 2014-10-18 DIAGNOSIS — Z7689 Persons encountering health services in other specified circumstances: Secondary | ICD-10-CM

## 2014-10-19 ENCOUNTER — Telehealth: Payer: Self-pay

## 2014-10-19 ENCOUNTER — Encounter: Payer: Self-pay | Admitting: Physician Assistant

## 2014-10-19 NOTE — Telephone Encounter (Signed)
Received FMLA paperwork via fax today.

## 2014-10-22 NOTE — Telephone Encounter (Signed)
Completed paperwork faxed back at [phone].  Fax confirmation received.  Forms labeled and placed in scan basket for scanning.  Charge slip placed in folder.

## 2014-11-12 ENCOUNTER — Other Ambulatory Visit: Payer: Self-pay | Admitting: Physician Assistant

## 2014-11-12 MED ORDER — TRAMADOL HCL 50 MG PO TABS: 50.0000 mg | ORAL_TABLET | Freq: Two times a day (BID) | ORAL | Status: DC | PRN

## 2014-11-15 ENCOUNTER — Encounter: Payer: Self-pay | Admitting: Physician Assistant

## 2014-11-21 ENCOUNTER — Other Ambulatory Visit: Payer: Self-pay | Admitting: Physician Assistant

## 2014-11-21 DIAGNOSIS — I10 Essential (primary) hypertension: Secondary | ICD-10-CM

## 2014-11-21 DIAGNOSIS — M545 Low back pain, unspecified: Secondary | ICD-10-CM

## 2014-11-21 DIAGNOSIS — G8929 Other chronic pain: Secondary | ICD-10-CM

## 2014-11-21 MED ORDER — LOSARTAN POTASSIUM 25 MG PO TABS: 25.0000 mg | ORAL_TABLET | Freq: Every day | ORAL | Status: DC

## 2014-11-21 MED ORDER — AMLODIPINE BESYLATE 10 MG PO TABS: 10.0000 mg | ORAL_TABLET | Freq: Every day | ORAL | Status: DC

## 2014-11-21 MED ORDER — FENOFIBRATE 48 MG PO TABS: 48.0000 mg | ORAL_TABLET | Freq: Every day | ORAL | Status: DC

## 2014-11-21 MED ORDER — HYDROCODONE-ACETAMINOPHEN 5-325 MG PO TABS: 1.0000 | ORAL_TABLET | Freq: Three times a day (TID) | ORAL | Status: DC | PRN

## 2014-11-23 ENCOUNTER — Other Ambulatory Visit: Payer: Self-pay | Admitting: Physician Assistant

## 2014-11-23 DIAGNOSIS — M545 Low back pain: Principal | ICD-10-CM

## 2014-11-23 DIAGNOSIS — G8929 Other chronic pain: Secondary | ICD-10-CM

## 2014-11-23 MED ORDER — HYDROCODONE-ACETAMINOPHEN 5-325 MG PO TABS: 1.0000 | ORAL_TABLET | Freq: Three times a day (TID) | ORAL | Status: DC | PRN

## 2014-12-26 ENCOUNTER — Other Ambulatory Visit: Payer: Self-pay | Admitting: Physician Assistant

## 2014-12-26 DIAGNOSIS — G8929 Other chronic pain: Secondary | ICD-10-CM

## 2014-12-26 DIAGNOSIS — M545 Low back pain: Principal | ICD-10-CM

## 2014-12-31 MED ORDER — TRAMADOL HCL 50 MG PO TABS: 50.0000 mg | ORAL_TABLET | Freq: Two times a day (BID) | ORAL | Status: DC | PRN

## 2014-12-31 MED ORDER — HYDROCODONE-ACETAMINOPHEN 5-325 MG PO TABS: 1.0000 | ORAL_TABLET | Freq: Three times a day (TID) | ORAL | Status: DC | PRN

## 2015-02-08 ENCOUNTER — Telehealth: Payer: Self-pay | Admitting: Physician Assistant

## 2015-02-08 NOTE — Telephone Encounter (Signed)
RX 12-31-2014 FOR TRAMADOL NOT PICKED UP SHREDDED

## 2015-02-26 ENCOUNTER — Ambulatory Visit (INDEPENDENT_AMBULATORY_CARE_PROVIDER_SITE_OTHER): Payer: 59 | Admitting: Physician Assistant

## 2015-02-26 ENCOUNTER — Encounter: Payer: Self-pay | Admitting: Physician Assistant

## 2015-02-26 VITALS — BP 148/98 | HR 81 | Temp 98.6°F | Resp 16 | Ht 72.0 in | Wt 229.0 lb

## 2015-02-26 DIAGNOSIS — H109 Unspecified conjunctivitis: Secondary | ICD-10-CM

## 2015-02-26 DIAGNOSIS — I10 Essential (primary) hypertension: Secondary | ICD-10-CM

## 2015-02-26 DIAGNOSIS — M545 Low back pain: Secondary | ICD-10-CM

## 2015-02-26 DIAGNOSIS — G8929 Other chronic pain: Secondary | ICD-10-CM | POA: Diagnosis not present

## 2015-02-26 MED ORDER — LOSARTAN POTASSIUM 25 MG PO TABS: 25.0000 mg | ORAL_TABLET | Freq: Every day | ORAL | Status: DC

## 2015-02-26 MED ORDER — PREGABALIN 75 MG PO CAPS: 75.0000 mg | ORAL_CAPSULE | Freq: Two times a day (BID) | ORAL | Status: DC

## 2015-02-26 MED ORDER — POLYMYXIN B-TRIMETHOPRIM 10000-0.1 UNIT/ML-% OP SOLN: OPHTHALMIC | Status: DC

## 2015-02-26 MED ORDER — HYDROCODONE-ACETAMINOPHEN 5-325 MG PO TABS: 1.0000 | ORAL_TABLET | Freq: Three times a day (TID) | ORAL | Status: DC | PRN

## 2015-02-26 MED ORDER — FENOFIBRATE 48 MG PO TABS: 48.0000 mg | ORAL_TABLET | Freq: Every day | ORAL | Status: DC

## 2015-02-26 MED ORDER — OMEPRAZOLE 20 MG PO CPDR: 20.0000 mg | DELAYED_RELEASE_CAPSULE | Freq: Every day | ORAL | Status: DC | PRN

## 2015-02-26 MED ORDER — AMLODIPINE BESYLATE 10 MG PO TABS: 10.0000 mg | ORAL_TABLET | Freq: Every day | ORAL | Status: DC

## 2015-02-26 MED ORDER — TRAMADOL HCL 50 MG PO TABS: 50.0000 mg | ORAL_TABLET | Freq: Two times a day (BID) | ORAL | Status: DC | PRN
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
## 2015-02-26 NOTE — Patient Instructions (Signed)
Please apply antibiotic to eye as directed. Avoid touching eyes.  I will call you once your forms are complete.

## 2015-02-26 NOTE — Assessment & Plan Note (Signed)
Medications refilled with new dosing instructions. Follow-up with Neurosurgery tomorrow regarding further intervention.

## 2015-02-26 NOTE — Progress Notes (Signed)
Pre visit review using our clinic review tool, if applicable. No additional management support is needed unless otherwise documented below in the visit note/SLS  

## 2015-02-26 NOTE — Assessment & Plan Note (Signed)
Rx Polytrim Op solution.  Apply as directed.  Supportive and hygiene measures reviewed with patient.

## 2015-02-27 ENCOUNTER — Encounter: Payer: Self-pay | Admitting: Physician Assistant

## 2015-02-28 ENCOUNTER — Encounter: Payer: Self-pay | Admitting: Physician Assistant

## 2015-03-01 ENCOUNTER — Telehealth: Payer: Self-pay | Admitting: Physician Assistant

## 2015-03-01 NOTE — Telephone Encounter (Signed)
Please call patient to let him know that both his and his son's [employer] forms are completed and ready for pickup at the front desk.

## 2015-03-01 NOTE — Telephone Encounter (Signed)
Informed patient of this.  °

## 2015-03-27 ENCOUNTER — Encounter: Payer: Self-pay | Admitting: Physician Assistant

## 2015-03-27 ENCOUNTER — Ambulatory Visit (INDEPENDENT_AMBULATORY_CARE_PROVIDER_SITE_OTHER): Payer: 59 | Admitting: Physician Assistant

## 2015-03-27 VITALS — BP 128/90 | HR 88 | Temp 98.8°F | Resp 16 | Wt 228.0 lb

## 2015-03-27 DIAGNOSIS — M25511 Pain in right shoulder: Secondary | ICD-10-CM

## 2015-03-27 MED ORDER — METHYLPREDNISOLONE ACETATE 80 MG/ML IJ SUSP
80.0000 mg | Freq: Once | INTRAMUSCULAR | Status: AC
  Administered 2015-03-27: 80 mg via INTRAMUSCULAR

## 2015-03-27 NOTE — Patient Instructions (Addendum)
Please limit heavy lifting over the next week. Please continue chronic medications as directed. There steroid should help calm things down.  Call if not resolving.
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
## 2015-03-27 NOTE — Progress Notes (Signed)
Pre visit review using our clinic review tool, if applicable. No additional management support is needed unless otherwise documented below in the visit note. 

## 2015-03-28 DIAGNOSIS — M25519 Pain in unspecified shoulder: Secondary | ICD-10-CM | POA: Insufficient documentation

## 2015-03-28 NOTE — Assessment & Plan Note (Signed)
With some radicular symptoms. Continue pain medication and musclerelaxant. Supportive measures reviewed. No heavy lifting for 1 week. Continue Lyrica as directed. Im Depomedrol given by nursing staff to calm nerve irritation. Follow-up with Neurosurgeon as scheduled.

## 2015-06-05 ENCOUNTER — Other Ambulatory Visit: Payer: Self-pay | Admitting: Physician Assistant

## 2015-06-05 DIAGNOSIS — M545 Low back pain, unspecified: Secondary | ICD-10-CM

## 2015-06-05 DIAGNOSIS — G8929 Other chronic pain: Secondary | ICD-10-CM

## 2015-06-05 MED ORDER — BENZONATATE 200 MG PO CAPS
200.0000 mg | ORAL_CAPSULE | Freq: Three times a day (TID) | ORAL | Status: DC | PRN
Start: 2015-06-05 — End: 2015-10-23

## 2015-06-05 MED ORDER — HYDROCODONE-ACETAMINOPHEN 5-325 MG PO TABS: 1.0000 | ORAL_TABLET | Freq: Three times a day (TID) | ORAL | Status: DC | PRN

## 2015-06-24 ENCOUNTER — Encounter: Payer: Self-pay | Admitting: Physician Assistant

## 2015-06-24 DIAGNOSIS — I10 Essential (primary) hypertension: Secondary | ICD-10-CM

## 2015-06-24 MED ORDER — LOSARTAN POTASSIUM 25 MG PO TABS: 25.0000 mg | ORAL_TABLET | Freq: Every day | ORAL | Status: DC

## 2015-06-24 MED ORDER — AMLODIPINE BESYLATE 10 MG PO TABS: 10.0000 mg | ORAL_TABLET | Freq: Every day | ORAL | Status: DC

## 2015-08-13 ENCOUNTER — Other Ambulatory Visit: Payer: Self-pay | Admitting: Physician Assistant

## 2015-08-14 ENCOUNTER — Other Ambulatory Visit: Payer: Self-pay | Admitting: Physician Assistant

## 2015-08-14 DIAGNOSIS — M545 Low back pain: Principal | ICD-10-CM

## 2015-08-14 DIAGNOSIS — G8929 Other chronic pain: Secondary | ICD-10-CM

## 2015-08-14 MED ORDER — HYDROCODONE-ACETAMINOPHEN 5-325 MG PO TABS: 1.0000 | ORAL_TABLET | Freq: Three times a day (TID) | ORAL | Status: DC | PRN

## 2015-08-14 MED ORDER — TRAMADOL HCL 50 MG PO TABS: 50.0000 mg | ORAL_TABLET | Freq: Two times a day (BID) | ORAL | Status: DC | PRN

## 2015-10-09 ENCOUNTER — Other Ambulatory Visit: Payer: Self-pay | Admitting: Family Medicine

## 2015-10-09 NOTE — Telephone Encounter (Signed)
No UDS Last filled 08/14/15 Last office visit 03/27/15  #60 with 0 refills

## 2015-10-23 ENCOUNTER — Ambulatory Visit (INDEPENDENT_AMBULATORY_CARE_PROVIDER_SITE_OTHER): Payer: Commercial Managed Care - PPO | Admitting: Physician Assistant

## 2015-10-23 ENCOUNTER — Encounter: Payer: Self-pay | Admitting: Physician Assistant

## 2015-10-23 VITALS — BP 131/80 | HR 88 | Temp 98.2°F | Ht 72.0 in | Wt 226.4 lb

## 2015-10-23 DIAGNOSIS — G8929 Other chronic pain: Secondary | ICD-10-CM

## 2015-10-23 DIAGNOSIS — M545 Low back pain, unspecified: Secondary | ICD-10-CM

## 2015-10-23 DIAGNOSIS — I10 Essential (primary) hypertension: Secondary | ICD-10-CM

## 2015-10-23 DIAGNOSIS — E781 Pure hyperglyceridemia: Secondary | ICD-10-CM

## 2015-10-23 MED ORDER — TRAMADOL HCL 50 MG PO TABS: 50.0000 mg | ORAL_TABLET | Freq: Two times a day (BID) | ORAL | Status: DC | PRN

## 2015-10-23 MED ORDER — HYDROCODONE-ACETAMINOPHEN 5-325 MG PO TABS: 1.0000 | ORAL_TABLET | Freq: Three times a day (TID) | ORAL | Status: DC | PRN
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
## 2015-10-23 NOTE — Assessment & Plan Note (Signed)
Will check fasting lipid panel. Dietary and exercise measures reviewed.

## 2015-10-23 NOTE — Assessment & Plan Note (Signed)
Well-controlled. Continue current regimen. Will check CMP.

## 2015-10-23 NOTE — Patient Instructions (Signed)
Please continue medications as directed.  Please schedule a lab appointment for tomorrow morning. Come fasting to that appointment.  Consider adding Melatonin 5 mg to help with sleep. Follow-up will be determined based on your results.

## 2015-10-23 NOTE — Assessment & Plan Note (Signed)
Improving. Continue medications. Will check UDS and LFT.

## 2015-10-23 NOTE — Progress Notes (Signed)
Pre visit review using our clinic review tool, if applicable. No additional management support is needed unless otherwise documented below in the visit note. 

## 2015-10-24 ENCOUNTER — Telehealth: Payer: Self-pay | Admitting: Physician Assistant

## 2015-10-24 NOTE — Telephone Encounter (Signed)
Caller name: Charles Jordan)   Relationship to patient: Self   Can be reached: [phone]  Reason for call: Pt says that he is suppose to have labs completed per PCP. Pt would like to know if he can have orders faxed over to his job which is [employer] so that he can complete orders.   Fax# [phone]    Please advise further.

## 2015-10-25 NOTE — Telephone Encounter (Signed)
Please advise.//AB/CMA 

## 2015-10-25 NOTE — Telephone Encounter (Signed)
Review EMR for lab orders placed at last visit and Dx codes. Ok to fax over an order for these labs -- I will sign once printed.

## 2015-10-28 NOTE — Telephone Encounter (Signed)
Printed letter for lab orders and dx on (Friday-10/25/15) and faxed to the pt's at work.  Confirmation received.//AB/CMA

## 2015-11-11 ENCOUNTER — Telehealth: Payer: Self-pay | Admitting: Physician Assistant

## 2015-11-11 DIAGNOSIS — G8929 Other chronic pain: Secondary | ICD-10-CM

## 2015-11-11 DIAGNOSIS — M545 Low back pain, unspecified: Secondary | ICD-10-CM

## 2015-11-11 MED ORDER — TRAMADOL HCL 50 MG PO TABS: 50.0000 mg | ORAL_TABLET | Freq: Two times a day (BID) | ORAL | Status: DC | PRN

## 2015-11-11 MED ORDER — HYDROCODONE-ACETAMINOPHEN 5-325 MG PO TABS: 1.0000 | ORAL_TABLET | Freq: Three times a day (TID) | ORAL | Status: DC | PRN

## 2015-11-11 NOTE — Telephone Encounter (Signed)
Relation to ZO:XWRU Call back [phone] Pharmacy:  Reason for call:  Patient states he lost his HYDROcodone-acetaminophen (NORCO/VICODIN) 5-325 MG tablet and traMADol (ULTRAM) 50 MG tablet Rx requesting a new one, please advise

## 2015-11-11 NOTE — Telephone Encounter (Signed)
Ok to phone in refill for Tramadol. I checked the [facility] to verify Rx has not been filled per office policy. Has not. Ok to reprint refill of Norco and I will sign so patient can pick up. We will not be able to do this again.

## 2015-11-11 NOTE — Telephone Encounter (Signed)
Rx for Tramadol printed and faxed to the pharmacy.  Rx for the Norco printed and placed up front for the pt to pick up.  Called and spoke with the pt's wife and informed her that both prescription had been approved and the pt will need to pick up the rx for the Norco.  She verbalized understanding and stated that she will come and pick up the prescription.//AB/CMA

## 2015-12-03 ENCOUNTER — Encounter: Payer: Self-pay | Admitting: Physician Assistant

## 2015-12-03 NOTE — Telephone Encounter (Signed)
Received correct documentation for provider; forwarded/SLS 03/14

## 2015-12-27 ENCOUNTER — Other Ambulatory Visit: Payer: Self-pay | Admitting: Physician Assistant

## 2015-12-27 NOTE — Telephone Encounter (Signed)
Last refill was on 11/11/2015  #60 with 0 refills Last Office visit on 10/23/2015 No UDS/Contract

## 2015-12-27 NOTE — Telephone Encounter (Signed)
Faxed hardcopy for Tramadol to [facility] in [place] 

## 2016-01-29 ENCOUNTER — Other Ambulatory Visit: Payer: Self-pay | Admitting: Physician Assistant

## 2016-01-29 NOTE — Telephone Encounter (Signed)
Refill sent per [facility] refill protocol/SLS  

## 2016-02-11 ENCOUNTER — Other Ambulatory Visit: Payer: Self-pay | Admitting: Physician Assistant

## 2016-02-11 ENCOUNTER — Encounter: Payer: Self-pay | Admitting: Physician Assistant

## 2016-02-11 DIAGNOSIS — G8929 Other chronic pain: Secondary | ICD-10-CM

## 2016-02-11 DIAGNOSIS — M545 Low back pain, unspecified: Secondary | ICD-10-CM

## 2016-02-11 MED ORDER — HYDROCODONE-ACETAMINOPHEN 5-325 MG PO TABS: 1.0000 | ORAL_TABLET | Freq: Three times a day (TID) | ORAL | Status: DC | PRN

## 2016-02-11 NOTE — Telephone Encounter (Signed)
Refill granted. He will have to give UDS sample at pickup.

## 2016-02-11 NOTE — Telephone Encounter (Signed)
Requesting Hydrocodone-acet-5-325mg -Take 1-2 tablets by mouth every 8 hours as needed for moderate pain. Last refill:11/11/15;#120,0 Last OV:10/23/15-BP check ZOX:WRUEADS:Never done Please advise.//AB/CMA

## 2016-02-12 NOTE — Telephone Encounter (Signed)
Requesting Tramadol 50mg -Take 1 tablet by mouth every 12 hours as needed. Last refill:12/27/15;#60,0 Last OV:10/23/15 Please advise.//AB/CMA

## 2016-03-05 ENCOUNTER — Encounter: Payer: Self-pay | Admitting: Physician Assistant

## 2016-03-07 MED ORDER — SILDENAFIL CITRATE 20 MG PO TABS: ORAL_TABLET | ORAL | Status: DC

## 2016-03-12 ENCOUNTER — Encounter: Payer: Self-pay | Admitting: Physician Assistant

## 2016-03-13 ENCOUNTER — Ambulatory Visit: Payer: Self-pay | Admitting: Physician Assistant

## 2016-03-16 ENCOUNTER — Telehealth: Payer: Self-pay | Admitting: *Deleted

## 2016-03-16 NOTE — Telephone Encounter (Signed)
Parent signed request of information received via fax. Forwarded to Jordan to scan/email to medical records. JG//CMA 

## 2016-04-27 ENCOUNTER — Other Ambulatory Visit: Payer: Self-pay | Admitting: Physician Assistant

## 2016-04-27 ENCOUNTER — Encounter: Payer: Self-pay | Admitting: Physician Assistant

## 2016-04-27 ENCOUNTER — Other Ambulatory Visit: Payer: Self-pay | Admitting: Family Medicine

## 2016-04-27 DIAGNOSIS — G8929 Other chronic pain: Secondary | ICD-10-CM

## 2016-04-27 DIAGNOSIS — M545 Low back pain, unspecified: Secondary | ICD-10-CM

## 2016-04-27 MED ORDER — HYDROCODONE-ACETAMINOPHEN 5-325 MG PO TABS: 1.0000 | ORAL_TABLET | Freq: Three times a day (TID) | ORAL | 0 refills | Status: DC | PRN

## 2016-04-27 MED ORDER — TRAMADOL HCL 50 MG PO TABS: 50.0000 mg | ORAL_TABLET | Freq: Two times a day (BID) | ORAL | 0 refills | Status: DC | PRN

## 2016-05-14 ENCOUNTER — Other Ambulatory Visit: Payer: Self-pay | Admitting: Physician Assistant

## 2016-05-14 DIAGNOSIS — I10 Essential (primary) hypertension: Secondary | ICD-10-CM

## 2016-05-18 ENCOUNTER — Encounter: Payer: Self-pay | Admitting: Physician Assistant

## 2016-05-18 ENCOUNTER — Ambulatory Visit (INDEPENDENT_AMBULATORY_CARE_PROVIDER_SITE_OTHER): Payer: Commercial Managed Care - PPO | Admitting: Physician Assistant

## 2016-05-18 VITALS — BP 126/86 | HR 76 | Temp 98.4°F | Resp 16 | Ht 72.0 in | Wt 219.1 lb

## 2016-05-18 DIAGNOSIS — I10 Essential (primary) hypertension: Secondary | ICD-10-CM

## 2016-05-18 DIAGNOSIS — M545 Low back pain, unspecified: Secondary | ICD-10-CM

## 2016-05-18 DIAGNOSIS — R7989 Other specified abnormal findings of blood chemistry: Secondary | ICD-10-CM

## 2016-05-18 DIAGNOSIS — M5010 Cervical disc disorder with radiculopathy, unspecified cervical region: Secondary | ICD-10-CM

## 2016-05-18 DIAGNOSIS — Z0000 Encounter for general adult medical examination without abnormal findings: Secondary | ICD-10-CM

## 2016-05-18 DIAGNOSIS — Z125 Encounter for screening for malignant neoplasm of prostate: Secondary | ICD-10-CM

## 2016-05-18 DIAGNOSIS — G8929 Other chronic pain: Secondary | ICD-10-CM

## 2016-05-18 LAB — COMPREHENSIVE METABOLIC PANEL
ALT: 28 U/L (ref 0–53)
AST: 20 U/L (ref 0–37)
Albumin: 4.5 g/dL (ref 3.5–5.2)
Alkaline Phosphatase: 86 U/L (ref 39–117)
BUN: 21 mg/dL (ref 6–23)
CHLORIDE: 101 meq/L (ref 96–112)
CO2: 30 meq/L (ref 19–32)
CREATININE: 0.79 mg/dL (ref 0.40–1.50)
Calcium: 9.2 mg/dL (ref 8.4–10.5)
GFR: 108.59 mL/min (ref >60.00)
Glucose, Bld: 127 mg/dL — ABNORMAL HIGH (ref 70–99)
POTASSIUM: 3.8 meq/L (ref 3.5–5.1)
SODIUM: 139 meq/L (ref 135–145)
Total Bilirubin: 0.6 mg/dL (ref 0.2–1.2)
Total Protein: 6.9 g/dL (ref 6.0–8.3)

## 2016-05-18 LAB — URINALYSIS, ROUTINE W REFLEX MICROSCOPIC
BILIRUBIN URINE: NEGATIVE
HGB URINE DIPSTICK: NEGATIVE
KETONES UR: NEGATIVE
LEUKOCYTES UA: NEGATIVE
Nitrite: NEGATIVE
PH: 5 (ref 5.0–8.0)
RBC / HPF: NONE SEEN (ref 0–?)
Specific Gravity, Urine: >=1.03 — AB (ref 1.000–1.030)
TOTAL PROTEIN, URINE-UPE24: NEGATIVE
URINE GLUCOSE: NEGATIVE
UROBILINOGEN UA: 0.2 (ref 0.0–1.0)
WBC UA: NONE SEEN (ref 0–?)

## 2016-05-18 LAB — CBC
HCT: 42 % (ref 39.0–52.0)
Hemoglobin: 14.4 g/dL (ref 13.0–17.0)
MCHC: 34.4 g/dL (ref 30.0–36.0)
MCV: 89.8 fl (ref 78.0–100.0)
Platelets: 239 10*3/uL (ref 150.0–400.0)
RBC: 4.68 Mil/uL (ref 4.22–5.81)
RDW: 12.6 % (ref 11.5–15.5)
WBC: 6.1 10*3/uL (ref 4.0–10.5)

## 2016-05-18 LAB — TSH: TSH: 1.51 u[IU]/mL (ref 0.35–4.50)

## 2016-05-18 LAB — HEMOGLOBIN A1C: HEMOGLOBIN A1C: 6.3 % (ref 4.6–6.5)

## 2016-05-18 LAB — LIPID PANEL
Cholesterol: 222 mg/dL — ABNORMAL HIGH (ref 0–200)
HDL: 39.6 mg/dL (ref >39.00)
NONHDL: 182.7
TRIGLYCERIDES: 234 mg/dL — AB (ref 0.0–149.0)
Total CHOL/HDL Ratio: 6
VLDL: 46.8 mg/dL — AB (ref 0.0–40.0)

## 2016-05-18 LAB — PSA: PSA: 0.63 ng/mL (ref 0.10–4.00)

## 2016-05-18 LAB — LDL CHOLESTEROL, DIRECT: Direct LDL: 119 mg/dL

## 2016-05-18 MED ORDER — GABAPENTIN 100 MG PO CAPS: ORAL_CAPSULE | ORAL | 1 refills | Status: DC

## 2016-05-18 MED ORDER — HYDROCODONE-ACETAMINOPHEN 5-325 MG PO TABS: 1.0000 | ORAL_TABLET | Freq: Three times a day (TID) | ORAL | 0 refills | Status: DC | PRN

## 2016-05-18 MED ORDER — TRAMADOL HCL 50 MG PO TABS: 50.0000 mg | ORAL_TABLET | Freq: Two times a day (BID) | ORAL | 0 refills | Status: DC | PRN

## 2016-05-18 MED ORDER — LOSARTAN POTASSIUM 25 MG PO TABS: 25.0000 mg | ORAL_TABLET | Freq: Every day | ORAL | 1 refills | Status: DC

## 2016-05-18 MED ORDER — AMLODIPINE BESYLATE 10 MG PO TABS: 10.0000 mg | ORAL_TABLET | Freq: Every day | ORAL | 1 refills | Status: DC

## 2016-05-18 NOTE — Assessment & Plan Note (Addendum)
Depression screen negative. Health Maintenance reviewed. Patient to schedule colonoscopy. Is having a hard time due to work schedule and transportation needs. Preventive schedule discussed and handout given in AVS. Will obtain fasting labs today.

## 2016-05-18 NOTE — Assessment & Plan Note (Signed)
The natural history of prostate cancer and ongoing controversy regarding screening and potential treatment outcomes of prostate cancer has been discussed with the patient. The meaning of a false positive PSA and a false negative PSA has been discussed. He indicates understanding of the limitations of this screening test and wishes  to proceed with screening PSA testing.  

## 2016-05-18 NOTE — Patient Instructions (Signed)
Please go to the lab for blood work.   Our office will call you with your results unless you have chosen to receive results via MyChart.  If your blood work is normal we will follow-up each year for physicals and as scheduled for chronic medical problems.  If anything is abnormal we will treat accordingly and get you in for a follow-up.  Please start the Gabapentin as directed to help with pain. Let me know how this works for you so we can adjust the dose. You will be contacted for an appointment with Neurosurgery.  Follow-up 1 month.  Preventive Care for Adults, Male A healthy lifestyle and preventive care can promote health and wellness. Preventive health guidelines for men include the following key practices:  A routine yearly physical is a good way to check with your health care provider about your health and preventative screening. It is a chance to share any concerns and updates on your health and to receive a thorough exam.  Visit your dentist for a routine exam and preventative care every 6 months. Brush your teeth twice a day and floss once a day. Good oral hygiene prevents tooth decay and gum disease.  The frequency of eye exams is based on your age, health, family medical history, use of contact lenses, and other factors. Follow your health care provider's recommendations for frequency of eye exams.  Eat a healthy diet. Foods such as vegetables, fruits, whole grains, low-fat dairy products, and lean protein foods contain the nutrients you need without too many calories. Decrease your intake of foods high in solid fats, added sugars, and salt. Eat the right amount of calories for you.Get information about a proper diet from your health care provider, if necessary.  Regular physical exercise is one of the most important things you can do for your health. Most adults should get at least 150 minutes of moderate-intensity exercise (any activity that increases your heart rate and  causes you to sweat) each week. In addition, most adults need muscle-strengthening exercises on 2 or more days a week.  Maintain a healthy weight. The body mass index (BMI) is a screening tool to identify possible weight problems. It provides an estimate of body fat based on height and weight. Your health care provider can find your BMI and can help you achieve or maintain a healthy weight.For adults 20 years and older:  A BMI below 18.5 is considered underweight.  A BMI of 18.5 to 24.9 is normal.  A BMI of 25 to 29.9 is considered overweight.  A BMI of 30 and above is considered obese.  Maintain normal blood lipids and cholesterol levels by exercising and minimizing your intake of saturated fat. Eat a balanced diet with plenty of fruit and vegetables. Blood tests for lipids and cholesterol should begin at age 5 and be repeated every 5 years. If your lipid or cholesterol levels are high, you are over 50, or you are at high risk for heart disease, you may need your cholesterol levels checked more frequently.Ongoing high lipid and cholesterol levels should be treated with medicines if diet and exercise are not working.  If you smoke, find out from your health care provider how to quit. If you do not use tobacco, do not start.  Lung cancer screening is recommended for adults aged 57-80 years who are at high risk for developing lung cancer because of a history of smoking. A yearly low-dose CT scan of the lungs is recommended for people who have  at least a 30-pack-year history of smoking and are a current smoker or have quit within the past 15 years. A pack year of smoking is smoking an average of 1 pack of cigarettes a day for 1 year (for example: 1 pack a day for 30 years or 2 packs a day for 15 years). Yearly screening should continue until the smoker has stopped smoking for at least 15 years. Yearly screening should be stopped for people who develop a health problem that would prevent them from  having lung cancer treatment.  If you choose to drink alcohol, do not have more than 2 drinks per day. One drink is considered to be 12 ounces (355 mL) of beer, 5 ounces (148 mL) of wine, or 1.5 ounces (44 mL) of liquor.  Avoid use of street drugs. Do not share needles with anyone. Ask for help if you need support or instructions about stopping the use of drugs.  High blood pressure causes heart disease and increases the risk of stroke. Your blood pressure should be checked at least every 1-2 years. Ongoing high blood pressure should be treated with medicines, if weight loss and exercise are not effective.  If you are 9-8 years old, ask your health care provider if you should take aspirin to prevent heart disease.  Diabetes screening is done by taking a blood sample to check your blood glucose level after you have not eaten for a certain period of time (fasting). If you are not overweight and you do not have risk factors for diabetes, you should be screened once every 3 years starting at age 4. If you are overweight or obese and you are 89-57 years of age, you should be screened for diabetes every year as part of your cardiovascular risk assessment.  Colorectal cancer can be detected and often prevented. Most routine colorectal cancer screening begins at the age of 26 and continues through age 69. However, your health care provider may recommend screening at an earlier age if you have risk factors for colon cancer. On a yearly basis, your health care provider may provide home test kits to check for hidden blood in the stool. Use of a small camera at the end of a tube to directly examine the colon (sigmoidoscopy or colonoscopy) can detect the earliest forms of colorectal cancer. Talk to your health care provider about this at age 27, when routine screening begins. Direct exam of the colon should be repeated every 5-10 years through age 79, unless early forms of precancerous polyps or small growths are  found.  People who are at an increased risk for hepatitis B should be screened for this virus. You are considered at high risk for hepatitis B if:  You were born in a country where hepatitis B occurs often. Talk with your health care provider about which countries are considered high risk.  Your parents were born in a high-risk country and you have not received a shot to protect against hepatitis B (hepatitis B vaccine).  You have HIV or AIDS.  You use needles to inject street drugs.  You live with, or have sex with, someone who has hepatitis B.  You are a man who has sex with other men (MSM).  You get hemodialysis treatment.  You take certain medicines for conditions such as cancer, organ transplantation, and autoimmune conditions.  Hepatitis C blood testing is recommended for all people born from [birth year] through 1965 and any individual with known risks for hepatitis C.  Practice  safe sex. Use condoms and avoid high-risk sexual practices to reduce the spread of sexually transmitted infections (STIs). STIs include gonorrhea, chlamydia, syphilis, trichomonas, herpes, HPV, and human immunodeficiency virus (HIV). Herpes, HIV, and HPV are viral illnesses that have no cure. They can result in disability, cancer, and death.  If you are a man who has sex with other men, you should be screened at least once per year for:  HIV.  Urethral, rectal, and pharyngeal infection of gonorrhea, chlamydia, or both.  If you are at risk of being infected with HIV, it is recommended that you take a prescription medicine daily to prevent HIV infection. This is called preexposure prophylaxis (PrEP). You are considered at risk if:  You are a man who has sex with other men (MSM) and have other risk factors.  You are a heterosexual man, are sexually active, and are at increased risk for HIV infection.  You take drugs by injection.  You are sexually active with a partner who has HIV.  Talk with your health  care provider about whether you are at high risk of being infected with HIV. If you choose to begin PrEP, you should first be tested for HIV. You should then be tested every 3 months for as long as you are taking PrEP.  A one-time screening for abdominal aortic aneurysm (AAA) and surgical repair of large AAAs by ultrasound are recommended for men ages 29 to 58 years who are current or former smokers.  Healthy men should no longer receive prostate-specific antigen (PSA) blood tests as part of routine cancer screening. Talk with your health care provider about prostate cancer screening.  Testicular cancer screening is not recommended for adult males who have no symptoms. Screening includes self-exam, a health care provider exam, and other screening tests. Consult with your health care provider about any symptoms you have or any concerns you have about testicular cancer.  Use sunscreen. Apply sunscreen liberally and repeatedly throughout the day. You should seek shade when your shadow is shorter than you. Protect yourself by wearing long sleeves, pants, a wide-brimmed hat, and sunglasses year round, whenever you are outdoors.  Once a month, do a whole-body skin exam, using a mirror to look at the skin on your back. Tell your health care provider about new moles, moles that have irregular borders, moles that are larger than a pencil eraser, or moles that have changed in shape or color.  Stay current with required vaccines (immunizations).  Influenza vaccine. All adults should be immunized every year.  Tetanus, diphtheria, and acellular pertussis (Td, Tdap) vaccine. An adult who has not previously received Tdap or who does not know his vaccine status should receive 1 dose of Tdap. This initial dose should be followed by tetanus and diphtheria toxoids (Td) booster doses every 10 years. Adults with an unknown or incomplete history of completing a 3-dose immunization series with Td-containing vaccines should  begin or complete a primary immunization series including a Tdap dose. Adults should receive a Td booster every 10 years.  Varicella vaccine. An adult without evidence of immunity to varicella should receive 2 doses or a second dose if he has previously received 1 dose.  Human papillomavirus (HPV) vaccine. Males aged 11-21 years who have not received the vaccine previously should receive the 3-dose series. Males aged 22-26 years may be immunized. Immunization is recommended through the age of 61 years for any male who has sex with males and did not get any or all doses earlier.  Immunization is recommended for any person with an immunocompromised condition through the age of 39 years if he did not get any or all doses earlier. During the 3-dose series, the second dose should be obtained 4-8 weeks after the first dose. The third dose should be obtained 24 weeks after the first dose and 16 weeks after the second dose.  Zoster vaccine. One dose is recommended for adults aged 9 years or older unless certain conditions are present.  Measles, mumps, and rubella (MMR) vaccine. Adults born before [birth year] generally are considered immune to measles and mumps. Adults born in [birth year] or later should have 1 or more doses of MMR vaccine unless there is a contraindication to the vaccine or there is laboratory evidence of immunity to each of the three diseases. A routine second dose of MMR vaccine should be obtained at least 28 days after the first dose for students attending postsecondary schools, health care workers, or international travelers. People who received inactivated measles vaccine or an unknown type of measles vaccine during 1963-1967 should receive 2 doses of MMR vaccine. People who received inactivated mumps vaccine or an unknown type of mumps vaccine before 1979 and are at high risk for mumps infection should consider immunization with 2 doses of MMR vaccine. Unvaccinated health care workers born before [birth year] who  lack laboratory evidence of measles, mumps, or rubella immunity or laboratory confirmation of disease should consider measles and mumps immunization with 2 doses of MMR vaccine or rubella immunization with 1 dose of MMR vaccine.  Pneumococcal 13-valent conjugate (PCV13) vaccine. When indicated, a person who is uncertain of his immunization history and has no record of immunization should receive the PCV13 vaccine. All adults 36 years of age and older should receive this vaccine. An adult aged 30 years or older who has certain medical conditions and has not been previously immunized should receive 1 dose of PCV13 vaccine. This PCV13 should be followed with a dose of pneumococcal polysaccharide (PPSV23) vaccine. Adults who are at high risk for pneumococcal disease should obtain the PPSV23 vaccine at least 8 weeks after the dose of PCV13 vaccine. Adults older than 54 years of age who have normal immune system function should obtain the PPSV23 vaccine dose at least 1 year after the dose of PCV13 vaccine.  Pneumococcal polysaccharide (PPSV23) vaccine. When PCV13 is also indicated, PCV13 should be obtained first. All adults aged 86 years and older should be immunized. An adult younger than age 75 years who has certain medical conditions should be immunized. Any person who resides in a nursing home or long-term care facility should be immunized. An adult smoker should be immunized. People with an immunocompromised condition and certain other conditions should receive both PCV13 and PPSV23 vaccines. People with human immunodeficiency virus (HIV) infection should be immunized as soon as possible after diagnosis. Immunization during chemotherapy or radiation therapy should be avoided. Routine use of PPSV23 vaccine is not recommended for American Indians, [place] Natives, or people younger than 65 years unless there are medical conditions that require PPSV23 vaccine. When indicated, people who have unknown immunization and  have no record of immunization should receive PPSV23 vaccine. One-time revaccination 5 years after the first dose of PPSV23 is recommended for people aged 19-64 years who have chronic kidney failure, nephrotic syndrome, asplenia, or immunocompromised conditions. People who received 1-2 doses of PPSV23 before age 68 years should receive another dose of PPSV23 vaccine at age 60 years or later if at least 5 years have passed  since the previous dose. Doses of PPSV23 are not needed for people immunized with PPSV23 at or after age 75 years.  Meningococcal vaccine. Adults with asplenia or persistent complement component deficiencies should receive 2 doses of quadrivalent meningococcal conjugate (MenACWY-D) vaccine. The doses should be obtained at least 2 months apart. Microbiologists working with certain meningococcal bacteria, [place] recruits, people at risk during an outbreak, and people who travel to or live in countries with a high rate of meningitis should be immunized. A first-year college student up through age 13 years who is living in a residence hall should receive a dose if he did not receive a dose on or after his 16th birthday. Adults who have certain high-risk conditions should receive one or more doses of vaccine.  Hepatitis A vaccine. Adults who wish to be protected from this disease, have chronic liver disease, work with hepatitis A-infected animals, work in hepatitis A research labs, or travel to or work in countries with a high rate of hepatitis A should be immunized. Adults who were previously unvaccinated and who anticipate close contact with an international adoptee during the first 60 days after arrival in the [place] from a country with a high rate of hepatitis A should be immunized.  Hepatitis B vaccine. Adults should be immunized if they wish to be protected from this disease, are under age 54 years and have diabetes, have chronic liver disease, have had more than one sex partner in  the past 6 months, may be exposed to blood or other infectious body fluids, are household contacts or sex partners of hepatitis B positive people, are clients or workers in certain care facilities, or travel to or work in countries with a high rate of hepatitis B.  Haemophilus influenzae type b (Hib) vaccine. A previously unvaccinated person with asplenia or sickle cell disease or having a scheduled splenectomy should receive 1 dose of Hib vaccine. Regardless of previous immunization, a recipient of a hematopoietic stem cell transplant should receive a 3-dose series 6-12 months after his successful transplant. Hib vaccine is not recommended for adults with HIV infection. Preventive Service / Frequency Ages 14 to 53  Blood pressure check.** / Every 3-5 years.  Lipid and cholesterol check.** / Every 5 years beginning at age 72.  Hepatitis C blood test.** / For any individual with known risks for hepatitis C.  Skin self-exam. / Monthly.  Influenza vaccine. / Every year.  Tetanus, diphtheria, and acellular pertussis (Tdap, Td) vaccine.** / Consult your health care provider. 1 dose of Td every 10 years.  Varicella vaccine.** / Consult your health care provider.  HPV vaccine. / 3 doses over 6 months, if 29 or younger.  Measles, mumps, rubella (MMR) vaccine.** / You need at least 1 dose of MMR if you were born in 1957 or later. You may also need a second dose.  Pneumococcal 13-valent conjugate (PCV13) vaccine.** / Consult your health care provider.  Pneumococcal polysaccharide (PPSV23) vaccine.** / 1 to 2 doses if you smoke cigarettes or if you have certain conditions.  Meningococcal vaccine.** / 1 dose if you are age 25 to 26 years and a [employer] researcher living in a residence hall, or have one of several medical conditions. You may also need additional booster doses.  Hepatitis A vaccine.** / Consult your health care provider.  Hepatitis B vaccine.** / Consult your health care  provider.  Haemophilus influenzae type b (Hib) vaccine.** / Consult your health care provider. Ages 42 to 54  Blood  pressure check.** / Every year.  Lipid and cholesterol check.** / Every 5 years beginning at age 64.  Lung cancer screening. / Every year if you are aged 99-80 years and have a 30-pack-year history of smoking and currently smoke or have quit within the past 15 years. Yearly screening is stopped once you have quit smoking for at least 15 years or develop a health problem that would prevent you from having lung cancer treatment.  Fecal occult blood test (FOBT) of stool. / Every year beginning at age 53 and continuing until age 71. You may not have to do this test if you get a colonoscopy every 10 years.  Flexible sigmoidoscopy** or colonoscopy.** / Every 5 years for a flexible sigmoidoscopy or every 10 years for a colonoscopy beginning at age 67 and continuing until age 79.  Hepatitis C blood test.** / For all people born from [birth year] through 1965 and any individual with known risks for hepatitis C.  Skin self-exam. / Monthly.  Influenza vaccine. / Every year.  Tetanus, diphtheria, and acellular pertussis (Tdap/Td) vaccine.** / Consult your health care provider. 1 dose of Td every 10 years.  Varicella vaccine.** / Consult your health care provider.  Zoster vaccine.** / 1 dose for adults aged 109 years or older.  Measles, mumps, rubella (MMR) vaccine.** / You need at least 1 dose of MMR if you were born in 1956 or later. You may also need a second dose.  Pneumococcal 13-valent conjugate (PCV13) vaccine.** / Consult your health care provider.  Pneumococcal polysaccharide (PPSV23) vaccine.** / 1 to 2 doses if you smoke cigarettes or if you have certain conditions.  Meningococcal vaccine.** / Consult your health care provider.  Hepatitis A vaccine.** / Consult your health care provider.  Hepatitis B vaccine.** / Consult your health care provider.  Haemophilus influenzae  type b (Hib) vaccine.** / Consult your health care provider. Ages 22 and over  Blood pressure check.** / Every year.  Lipid and cholesterol check.**/ Every 5 years beginning at age 20.  Lung cancer screening. / Every year if you are aged 37-80 years and have a 30-pack-year history of smoking and currently smoke or have quit within the past 15 years. Yearly screening is stopped once you have quit smoking for at least 15 years or develop a health problem that would prevent you from having lung cancer treatment.  Fecal occult blood test (FOBT) of stool. / Every year beginning at age 60 and continuing until age 85. You may not have to do this test if you get a colonoscopy every 10 years.  Flexible sigmoidoscopy** or colonoscopy.** / Every 5 years for a flexible sigmoidoscopy or every 10 years for a colonoscopy beginning at age 57 and continuing until age 36.  Hepatitis C blood test.** / For all people born from [birth year] through 1965 and any individual with known risks for hepatitis C.  Abdominal aortic aneurysm (AAA) screening.** / A one-time screening for ages 9 to 69 years who are current or former smokers.  Skin self-exam. / Monthly.  Influenza vaccine. / Every year.  Tetanus, diphtheria, and acellular pertussis (Tdap/Td) vaccine.** / 1 dose of Td every 10 years.  Varicella vaccine.** / Consult your health care provider.  Zoster vaccine.** / 1 dose for adults aged 32 years or older.  Pneumococcal 13-valent conjugate (PCV13) vaccine.** / 1 dose for all adults aged 73 years and older.  Pneumococcal polysaccharide (PPSV23) vaccine.** / 1 dose for all adults aged 55 years and older.  Meningococcal  vaccine.** / Consult your health care provider.  Hepatitis A vaccine.** / Consult your health care provider.  Hepatitis B vaccine.** / Consult your health care provider.  Haemophilus influenzae type b (Hib) vaccine.** / Consult your health care provider. **Family history and personal history  of risk and conditions may change your health care provider's recommendations.   This information is not intended to replace advice given to you by your health care provider. Make sure you discuss any questions you have with your health care provider.   Document Released: 11/03/2001 Document Revised: 09/28/2014 Document Reviewed: 02/02/2011 Elsevier Interactive Patient Education [employer].
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
## 2016-05-18 NOTE — Assessment & Plan Note (Signed)
Continue pain medication. Will begin Gabapentin at 100 mg BID x 1 week. Then increase PM dose to 300 mg.  Referral to neurosurgery placed.

## 2016-05-18 NOTE — Assessment & Plan Note (Signed)
BP well-controlled. Asymptomatic. Will obtain labs today. Continue current medication regimen.

## 2016-05-19 ENCOUNTER — Encounter: Payer: Self-pay | Admitting: Physician Assistant

## 2016-06-09 ENCOUNTER — Encounter: Payer: Self-pay | Admitting: Physician Assistant

## 2016-06-09 DIAGNOSIS — M5090 Cervical disc disorder, unspecified, unspecified cervical region: Secondary | ICD-10-CM

## 2016-06-10 MED ORDER — CYCLOBENZAPRINE HCL 10 MG PO TABS: 10.0000 mg | ORAL_TABLET | Freq: Every day | ORAL | 5 refills | Status: DC

## 2016-06-10 NOTE — Addendum Note (Signed)
Addended by: Marcelline MatesMARTIN, Simora Dingee on: 06/10/2016 09:03 AM   Modules accepted: Orders

## 2016-06-17 ENCOUNTER — Ambulatory Visit (INDEPENDENT_AMBULATORY_CARE_PROVIDER_SITE_OTHER): Payer: Commercial Managed Care - PPO | Admitting: Physician Assistant

## 2016-06-17 ENCOUNTER — Encounter: Payer: Self-pay | Admitting: Physician Assistant

## 2016-06-17 VITALS — BP 108/78 | HR 78 | Temp 98.3°F | Resp 16 | Ht 72.0 in | Wt 217.2 lb

## 2016-06-17 DIAGNOSIS — G8929 Other chronic pain: Secondary | ICD-10-CM

## 2016-06-17 DIAGNOSIS — M5010 Cervical disc disorder with radiculopathy, unspecified cervical region: Secondary | ICD-10-CM

## 2016-06-17 DIAGNOSIS — M545 Low back pain: Secondary | ICD-10-CM

## 2016-06-17 MED ORDER — GABAPENTIN 100 MG PO CAPS: ORAL_CAPSULE | ORAL | 5 refills | Status: DC

## 2016-06-17 NOTE — Assessment & Plan Note (Signed)
Improved. Will continue muscle relaxant, supportive measures and pain medication on a PRN basis. Will increase gabapentin dosing to 100, 100 and 300.

## 2016-06-17 NOTE — Assessment & Plan Note (Signed)
Pain resolved with Gabapentin. We are titrating dose up to help with low back pain. Patient to schedule appointment with Neurosurgery.

## 2016-06-17 NOTE — Patient Instructions (Signed)
Please continue medications with the following changes:  -- Gabapentin -- take 1 tablet each morning and at noon. Take 3 tablets at bedtime.   Follow-up with me in 3 months. Make sure to schedule an appointment with the Neurosurgeon.
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
## 2016-06-17 NOTE — Progress Notes (Signed)
Pre visit review using our clinic review tool, if applicable. No additional management support is needed unless otherwise documented below in the visit note/SLS  

## 2016-06-26 ENCOUNTER — Other Ambulatory Visit: Payer: Self-pay | Admitting: Physician Assistant

## 2016-06-26 DIAGNOSIS — M545 Low back pain: Principal | ICD-10-CM

## 2016-06-26 DIAGNOSIS — G8929 Other chronic pain: Secondary | ICD-10-CM

## 2016-06-29 MED ORDER — TRAMADOL HCL 50 MG PO TABS: 50.0000 mg | ORAL_TABLET | Freq: Two times a day (BID) | ORAL | 0 refills | Status: DC | PRN

## 2016-06-29 MED ORDER — HYDROCODONE-ACETAMINOPHEN 5-325 MG PO TABS: 1.0000 | ORAL_TABLET | Freq: Three times a day (TID) | ORAL | 0 refills | Status: DC | PRN

## 2016-08-02 ENCOUNTER — Other Ambulatory Visit: Payer: Self-pay | Admitting: Physician Assistant

## 2016-08-02 DIAGNOSIS — G8929 Other chronic pain: Secondary | ICD-10-CM

## 2016-08-02 DIAGNOSIS — M545 Low back pain, unspecified: Secondary | ICD-10-CM

## 2016-08-03 NOTE — Telephone Encounter (Signed)
Ok to refill  Tramadol 30 day (print) . For Hydrocodone he would have to come pick up at the [place] office as I have moved. Needs UDS before further refills can be given. Also check and update CSC if due. Thank you.

## 2016-08-03 NOTE — Telephone Encounter (Signed)
Pt due for CSC as well. I have informed Pt via MyChart of PCP's new location address and telephone number.

## 2016-08-03 NOTE — Telephone Encounter (Signed)
Pt is requesting refill on Hydrocodone and Tramadol. Pt requesting 60 or 90 day supply.  Last OV: 06/17/2016 Last Fill on 06/29/2016 30 days for both given UDS: None

## 2016-08-28 ENCOUNTER — Other Ambulatory Visit: Payer: Self-pay | Admitting: Emergency Medicine

## 2016-08-28 ENCOUNTER — Encounter: Payer: Self-pay | Admitting: Emergency Medicine

## 2016-08-28 ENCOUNTER — Encounter: Payer: Self-pay | Admitting: Physician Assistant

## 2016-08-28 DIAGNOSIS — M545 Low back pain: Principal | ICD-10-CM

## 2016-08-28 DIAGNOSIS — G8929 Other chronic pain: Secondary | ICD-10-CM

## 2016-08-28 MED ORDER — HYDROCODONE-ACETAMINOPHEN 5-325 MG PO TABS: 1.0000 | ORAL_TABLET | Freq: Three times a day (TID) | ORAL | 0 refills | Status: DC | PRN

## 2016-08-28 MED ORDER — TRAMADOL HCL 50 MG PO TABS: 50.0000 mg | ORAL_TABLET | Freq: Two times a day (BID) | ORAL | 0 refills | Status: DC | PRN

## 2016-09-06 ENCOUNTER — Other Ambulatory Visit: Payer: Self-pay | Admitting: Physician Assistant

## 2016-09-06 DIAGNOSIS — I10 Essential (primary) hypertension: Secondary | ICD-10-CM

## 2016-09-07 ENCOUNTER — Other Ambulatory Visit: Payer: Self-pay | Admitting: Emergency Medicine

## 2016-09-07 ENCOUNTER — Encounter: Payer: Self-pay | Admitting: Physician Assistant

## 2016-09-28 ENCOUNTER — Ambulatory Visit: Payer: Commercial Managed Care - PPO | Admitting: Physician Assistant

## 2016-10-06 ENCOUNTER — Encounter: Payer: Self-pay | Admitting: Physician Assistant

## 2016-10-06 MED ORDER — CYCLOBENZAPRINE HCL 10 MG PO TABS: 10.0000 mg | ORAL_TABLET | Freq: Every day | ORAL | 1 refills | Status: DC

## 2016-10-07 ENCOUNTER — Ambulatory Visit: Payer: Self-pay | Admitting: Physician Assistant

## 2016-10-13 ENCOUNTER — Ambulatory Visit (INDEPENDENT_AMBULATORY_CARE_PROVIDER_SITE_OTHER): Payer: Commercial Managed Care - PPO | Admitting: Physician Assistant

## 2016-10-13 ENCOUNTER — Encounter: Payer: Self-pay | Admitting: Physician Assistant

## 2016-10-13 VITALS — BP 140/86 | HR 73 | Temp 98.5°F | Resp 16 | Ht 72.0 in | Wt 219.0 lb

## 2016-10-13 DIAGNOSIS — R5383 Other fatigue: Secondary | ICD-10-CM | POA: Diagnosis not present

## 2016-10-13 DIAGNOSIS — M545 Low back pain: Secondary | ICD-10-CM

## 2016-10-13 DIAGNOSIS — G8929 Other chronic pain: Secondary | ICD-10-CM

## 2016-10-13 LAB — HEPATIC FUNCTION PANEL
ALT: 39 U/L (ref 10–40)
AST: 22 U/L (ref 14–40)
Alkaline Phosphatase: 82 U/L (ref 25–125)
Bilirubin, Total: 0.7 mg/dL

## 2016-10-13 LAB — BASIC METABOLIC PANEL
BUN: 20 mg/dL (ref 4–21)
Creatinine: 0.7 mg/dL (ref 0.6–1.3)
GLUCOSE: 118 mg/dL
Potassium: 4.1 mmol/L (ref 3.4–5.3)
Sodium: 139 mmol/L (ref 137–147)

## 2016-10-13 MED ORDER — TRAMADOL HCL ER 100 MG PO TB24: 100.0000 mg | ORAL_TABLET | Freq: Every day | ORAL | 0 refills | Status: DC

## 2016-10-13 NOTE — Patient Instructions (Signed)
Please follow-up with your specialist tomorrow as scheduled.  We are swapping out current pain medication for the Tramadol ER.  Let me know how things are going on this regimen.  I will call you with your lab results. You will be contacted for screening colonoscopy.  Follow-up in 1 month.
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
## 2016-10-13 NOTE — Progress Notes (Signed)
Pre visit review using our clinic review tool, if applicable. No additional management support is needed unless otherwise documented below in the visit note. 

## 2016-10-13 NOTE — Assessment & Plan Note (Signed)
Has appointment with new Neurosurgeon tomorrow.  Will attempt trial of Tramadol ER at 100 mg to see if this gives longer relief of pain.  [place] Database reviewed -- no red flags. UDS up-to-date.  Follow-up 1 month.

## 2016-10-13 NOTE — Assessment & Plan Note (Signed)
Repeat testosterone level today. 

## 2016-10-14 ENCOUNTER — Encounter: Payer: Self-pay | Admitting: Physician Assistant

## 2016-10-16 ENCOUNTER — Other Ambulatory Visit: Payer: Self-pay | Admitting: Physician Assistant

## 2016-10-16 DIAGNOSIS — Z1211 Encounter for screening for malignant neoplasm of colon: Secondary | ICD-10-CM

## 2016-10-19 ENCOUNTER — Telehealth: Payer: Self-pay | Admitting: *Deleted

## 2016-10-19 NOTE — Telephone Encounter (Signed)
Left message for patient to call back to discuss recent labs that were done.   It appears that the testosterone level was not done, and we need to know if he is going to go back to [facility] to have that drawn or if he wants to come here to the office.

## 2016-10-20 ENCOUNTER — Encounter: Payer: Self-pay | Admitting: Physician Assistant

## 2016-10-20 ENCOUNTER — Telehealth: Payer: Self-pay | Admitting: Physician Assistant

## 2016-10-20 NOTE — Telephone Encounter (Signed)
[facility].  Levels within normal limits.   Will send to scanning to be put in his EMR

## 2016-10-20 NOTE — Telephone Encounter (Signed)
LMOVM for lab results of [facility]

## 2016-11-01 ENCOUNTER — Encounter: Payer: Self-pay | Admitting: Physician Assistant

## 2016-11-01 DIAGNOSIS — R5383 Other fatigue: Secondary | ICD-10-CM

## 2016-11-11 ENCOUNTER — Other Ambulatory Visit: Payer: Self-pay | Admitting: Physician Assistant

## 2016-11-11 ENCOUNTER — Encounter: Payer: Self-pay | Admitting: Physician Assistant

## 2016-11-12 ENCOUNTER — Other Ambulatory Visit (INDEPENDENT_AMBULATORY_CARE_PROVIDER_SITE_OTHER): Payer: Commercial Managed Care - PPO

## 2016-11-12 DIAGNOSIS — R5383 Other fatigue: Secondary | ICD-10-CM | POA: Diagnosis not present

## 2016-11-12 LAB — TESTOSTERONE: Testosterone: 239.55 ng/dL — ABNORMAL LOW (ref 300.00–890.00)

## 2016-11-12 MED ORDER — TRAMADOL HCL ER 100 MG PO TB24: 100.0000 mg | ORAL_TABLET | Freq: Every day | ORAL | 0 refills | Status: DC

## 2016-11-16 ENCOUNTER — Encounter: Payer: Self-pay | Admitting: Physician Assistant

## 2016-11-17 ENCOUNTER — Other Ambulatory Visit: Payer: Self-pay | Admitting: Physician Assistant

## 2016-11-17 DIAGNOSIS — R7989 Other specified abnormal findings of blood chemistry: Secondary | ICD-10-CM

## 2016-11-24 ENCOUNTER — Other Ambulatory Visit: Payer: Self-pay | Admitting: Physician Assistant

## 2016-11-24 DIAGNOSIS — I10 Essential (primary) hypertension: Secondary | ICD-10-CM

## 2016-11-29 ENCOUNTER — Other Ambulatory Visit: Payer: Self-pay | Admitting: Physician Assistant

## 2016-12-10 ENCOUNTER — Other Ambulatory Visit: Payer: Self-pay | Admitting: Physician Assistant

## 2016-12-10 NOTE — Telephone Encounter (Signed)
Indication for: Chronic Back pain Medication and dose: Tramadol ER 100 mg # pills per month: #30 11/12/16 Last UDS date: 08/28/16 high not detected Pain contract signed (Y/N): Yes 08/28/16 Date narcotic database last reviewed (include red flags):

## 2016-12-11 ENCOUNTER — Other Ambulatory Visit: Payer: Self-pay | Admitting: Physician Assistant

## 2016-12-11 MED ORDER — TRAMADOL HCL ER 100 MG PO TB24: 100.0000 mg | ORAL_TABLET | Freq: Every day | ORAL | 0 refills | Status: DC

## 2016-12-11 NOTE — Telephone Encounter (Signed)
Last OV 10/13/16 (back pain) Tramadol last filled 12/11/16 #30 with 0

## 2016-12-11 NOTE — Telephone Encounter (Signed)
 database reviewed -- no red flags.  Will give UDS at pickup.  Refill printed for pickup.  Dr Beverely Lowabori -- Ok to continue Tramadol ER for chronic low back pain. Patient is also seeing Neurosurgery at present.

## 2016-12-11 NOTE — Telephone Encounter (Signed)
Ok to continue

## 2016-12-16 LAB — HM COLONOSCOPY

## 2016-12-17 ENCOUNTER — Encounter: Payer: Self-pay | Admitting: Physician Assistant

## 2016-12-21 ENCOUNTER — Other Ambulatory Visit: Payer: Self-pay | Admitting: Physician Assistant

## 2016-12-21 MED ORDER — TRAMADOL HCL ER 100 MG PO TB24: 100.0000 mg | ORAL_TABLET | Freq: Every day | ORAL | 0 refills | Status: DC

## 2017-01-05 ENCOUNTER — Other Ambulatory Visit: Payer: Self-pay | Admitting: Physician Assistant

## 2017-01-05 DIAGNOSIS — M5010 Cervical disc disorder with radiculopathy, unspecified cervical region: Secondary | ICD-10-CM

## 2017-01-12 ENCOUNTER — Other Ambulatory Visit: Payer: Self-pay | Admitting: Physician Assistant

## 2017-01-13 NOTE — Telephone Encounter (Signed)
Indication for medication: Chronic Low Back Pain Medication and dose:  Tramadol ER 100 mg # pills per month: #30 0RF on 12/21/16 Last UDS date: 08/28/16 Pain contract signed (Y/N): Yes signed 09/01/16 Date narcotic database last reviewed (include red flags):  Last OV: 10/13/16 Please advised

## 2017-01-18 ENCOUNTER — Other Ambulatory Visit: Payer: Self-pay | Admitting: Physician Assistant

## 2017-01-18 MED ORDER — TRAMADOL HCL ER 100 MG PO TB24: 100.0000 mg | ORAL_TABLET | Freq: Every day | ORAL | 0 refills | Status: DC

## 2017-01-18 NOTE — Telephone Encounter (Signed)
Rx faxed to the [facility] pharmacy 

## 2017-01-18 NOTE — Telephone Encounter (Signed)
Database reviewed today -- no red flags.  Refill granted. Rx printed. Can be phoned in. Noted on Rx -- cannot be filled until due on 01/20/17

## 2017-01-18 NOTE — Telephone Encounter (Signed)
Indication for chronic med: Chronic low back pain Medication and dose: Tramadol ER 100 mg # pills per month: #30 Last UDS date: 08/28/16 Pain contract signed (Y/N): Yes, 09/01/16 Date narcotic database last reviewed (include red flags):   Please advise

## 2017-01-26 ENCOUNTER — Encounter: Payer: Self-pay | Admitting: Physician Assistant

## 2017-02-14 ENCOUNTER — Other Ambulatory Visit: Payer: Self-pay | Admitting: Physician Assistant

## 2017-02-18 NOTE — Telephone Encounter (Signed)
Indication for chronic medication: Chronic low back pain Medication and dose: Tramadol 100 mg # pills per month: #30 on 01/18/17 Last UDS date: 08/28/16 Pain contract signed (Y/N): Yes-09/01/16 Date narcotic database last reviewed (include red flags):   Last ov: 10/13/16

## 2017-02-19 ENCOUNTER — Encounter: Payer: Self-pay | Admitting: Emergency Medicine

## 2017-02-19 NOTE — Telephone Encounter (Signed)
Rx faxed to the pharmacy. My chart message sent advising patient of refill

## 2017-02-19 NOTE — Telephone Encounter (Signed)
Refill granted.  Database reviewed today. No red flags.  Will need follow-up before further refills.

## 2017-02-25 ENCOUNTER — Telehealth: Payer: Self-pay | Admitting: *Deleted

## 2017-02-25 NOTE — Telephone Encounter (Signed)
[facility], forwarded to [place] for email/scan/SLS 06/07

## 2017-03-19 ENCOUNTER — Other Ambulatory Visit: Payer: Self-pay | Admitting: Emergency Medicine

## 2017-03-30 ENCOUNTER — Other Ambulatory Visit: Payer: Self-pay | Admitting: Emergency Medicine

## 2017-03-30 MED ORDER — CYCLOBENZAPRINE HCL 10 MG PO TABS: 10.0000 mg | ORAL_TABLET | Freq: Every day | ORAL | 1 refills | Status: DC

## 2017-04-21 ENCOUNTER — Encounter: Payer: Self-pay | Admitting: Physician Assistant

## 2017-04-22 MED ORDER — TRAMADOL HCL 50 MG PO TABS: 50.0000 mg | ORAL_TABLET | Freq: Two times a day (BID) | ORAL | 0 refills | Status: DC | PRN

## 2017-04-22 NOTE — Addendum Note (Signed)
Addended by: Waldon MerlMARTIN, Cambrey Lupi C on: 04/22/2017 08:50 AM   Modules accepted: Orders

## 2017-04-28 ENCOUNTER — Encounter: Payer: Self-pay | Admitting: Physician Assistant

## 2017-05-18 ENCOUNTER — Encounter: Payer: Self-pay | Admitting: Emergency Medicine

## 2017-06-23 ENCOUNTER — Other Ambulatory Visit: Payer: Self-pay | Admitting: Physician Assistant

## 2017-06-23 DIAGNOSIS — I10 Essential (primary) hypertension: Secondary | ICD-10-CM

## 2017-06-24 ENCOUNTER — Encounter: Payer: Self-pay | Admitting: Physician Assistant

## 2017-07-02 ENCOUNTER — Encounter: Payer: Self-pay | Admitting: Physician Assistant

## 2017-07-09 ENCOUNTER — Encounter: Payer: Self-pay | Admitting: Physician Assistant

## 2018-02-03 ENCOUNTER — Encounter: Payer: Self-pay | Admitting: Emergency Medicine

## 2018-02-17 ENCOUNTER — Encounter: Payer: Self-pay | Admitting: Physician Assistant

## 2018-03-25 ENCOUNTER — Encounter: Payer: Self-pay | Admitting: Physician Assistant

## 2018-04-22 ENCOUNTER — Encounter: Payer: Self-pay | Admitting: Physician Assistant

## 2018-04-25 ENCOUNTER — Encounter: Payer: Self-pay | Admitting: Family Medicine

## 2018-04-26 ENCOUNTER — Other Ambulatory Visit: Payer: Self-pay | Admitting: Physician Assistant

## 2018-04-26 DIAGNOSIS — I10 Essential (primary) hypertension: Secondary | ICD-10-CM

## 2018-04-26 NOTE — Telephone Encounter (Signed)
Last OV 10/13/16, Next OV with Dr. Warner MccreedyJessica Copeland 08/17/18  Amlodipine last filled 06/24/17, # 90 with 0 refills  Losartan last filled 06/24/17, # 90 with 0 refills  Please advise if okay to decline as it looks he has not had these medications in a long time.

## 2018-05-06 ENCOUNTER — Encounter: Payer: Self-pay | Admitting: Physician Assistant

## 2018-05-06 ENCOUNTER — Encounter: Payer: Self-pay | Admitting: Family Medicine

## 2018-05-06 DIAGNOSIS — M542 Cervicalgia: Secondary | ICD-10-CM
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
## 2018-07-27 ENCOUNTER — Ambulatory Visit: Payer: PRIVATE HEALTH INSURANCE | Admitting: Family Medicine

## 2018-07-27 ENCOUNTER — Encounter: Payer: Self-pay | Admitting: Family Medicine

## 2018-07-27 VITALS — BP 140/96 | HR 102 | Temp 98.3°F | Resp 16 | Ht 72.0 in | Wt 229.0 lb

## 2018-07-27 DIAGNOSIS — I10 Essential (primary) hypertension: Secondary | ICD-10-CM

## 2018-07-27 DIAGNOSIS — M5010 Cervical disc disorder with radiculopathy, unspecified cervical region: Secondary | ICD-10-CM | POA: Diagnosis not present

## 2018-07-27 DIAGNOSIS — M7581 Other shoulder lesions, right shoulder: Secondary | ICD-10-CM

## 2018-07-27 MED ORDER — LOSARTAN POTASSIUM 25 MG PO TABS: 25.0000 mg | ORAL_TABLET | Freq: Every day | ORAL | 3 refills | Status: DC

## 2018-07-27 MED ORDER — TRAMADOL HCL 50 MG PO TABS: 50.0000 mg | ORAL_TABLET | Freq: Two times a day (BID) | ORAL | 0 refills | Status: DC | PRN

## 2018-07-27 MED ORDER — AMLODIPINE BESYLATE 10 MG PO TABS: 10.0000 mg | ORAL_TABLET | Freq: Every day | ORAL | 3 refills | Status: AC

## 2018-07-27 MED ORDER — GABAPENTIN 100 MG PO CAPS: ORAL_CAPSULE | ORAL | 3 refills | Status: AC

## 2018-07-27 NOTE — Patient Instructions (Addendum)
It was nice to meet you today!  I will set you up to see a shoulder specialist  In the meantime start on the exercises we discussed today Use tramadol as needed for pain but remember this will make you drowsy  [email or web address]://orthoinfo.aaos.org/globalassets/pdfs/[email or web address]-rehab_shoulder.pdf

## 2018-07-28 ENCOUNTER — Telehealth: Payer: Self-pay | Admitting: Medical

## 2018-07-28 NOTE — Telephone Encounter (Signed)
Opened by accident. Pt sent my chart message on his daughter.

## 2018-07-29 NOTE — Telephone Encounter (Signed)
Sent message to [facility] in absence of PCP

## 2018-08-04 ENCOUNTER — Ambulatory Visit
Admission: RE | Admit: 2018-08-04 | Discharge: 2018-08-04 | Disposition: A | Discharge status: Home / Self Care | Payer: PRIVATE HEALTH INSURANCE | Source: Ambulatory Visit | Attending: Physician Assistant | Admitting: Physician Assistant

## 2018-08-04 ENCOUNTER — Other Ambulatory Visit: Payer: Self-pay | Admitting: Physician Assistant

## 2018-08-04 DIAGNOSIS — M25511 Pain in right shoulder: Secondary | ICD-10-CM
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
## 2018-08-17 ENCOUNTER — Encounter: Payer: Self-pay | Admitting: Family Medicine

## 2018-08-17 ENCOUNTER — Ambulatory Visit: Payer: PRIVATE HEALTH INSURANCE | Admitting: Family Medicine

## 2018-08-17 VITALS — BP 142/92 | HR 97 | Temp 98.4°F | Resp 16 | Ht 72.0 in | Wt 228.0 lb

## 2018-08-17 DIAGNOSIS — Z125 Encounter for screening for malignant neoplasm of prostate: Secondary | ICD-10-CM

## 2018-08-17 DIAGNOSIS — I10 Essential (primary) hypertension: Secondary | ICD-10-CM

## 2018-08-17 DIAGNOSIS — Z131 Encounter for screening for diabetes mellitus: Secondary | ICD-10-CM

## 2018-08-17 DIAGNOSIS — Z130 Encounter for screening for diseases of the blood and blood-forming organs and certain disorders involving the immune mechanism: Secondary | ICD-10-CM

## 2018-08-17 DIAGNOSIS — M7581 Other shoulder lesions, right shoulder: Secondary | ICD-10-CM

## 2018-08-17 DIAGNOSIS — Z1159 Encounter for screening for other viral diseases: Secondary | ICD-10-CM

## 2018-08-17 DIAGNOSIS — E119 Type 2 diabetes mellitus without complications: Secondary | ICD-10-CM

## 2018-08-17 LAB — COMPREHENSIVE METABOLIC PANEL
ALBUMIN: 4.6 g/dL (ref 3.5–5.2)
ALK PHOS: 93 U/L (ref 39–117)
ALT: 39 U/L (ref 0–53)
AST: 19 U/L (ref 0–37)
BUN: 13 mg/dL (ref 6–23)
CO2: 28 mEq/L (ref 19–32)
Calcium: 9.7 mg/dL (ref 8.4–10.5)
Chloride: 98 mEq/L (ref 96–112)
Creatinine, Ser: 0.89 mg/dL (ref 0.40–1.50)
GFR: 93.86 mL/min (ref >60.00)
Glucose, Bld: 324 mg/dL — ABNORMAL HIGH (ref 70–99)
Potassium: 3.9 mEq/L (ref 3.5–5.1)
SODIUM: 137 meq/L (ref 135–145)
TOTAL PROTEIN: 7 g/dL (ref 6.0–8.3)
Total Bilirubin: 0.7 mg/dL (ref 0.2–1.2)

## 2018-08-17 LAB — CBC
HEMATOCRIT: 50.3 % (ref 39.0–52.0)
Hemoglobin: 17.1 g/dL — ABNORMAL HIGH (ref 13.0–17.0)
MCHC: 34 g/dL (ref 30.0–36.0)
MCV: 92.8 fl (ref 78.0–100.0)
Platelets: 237 10*3/uL (ref 150.0–400.0)
RBC: 5.42 Mil/uL (ref 4.22–5.81)
RDW: 13.1 % (ref 11.5–15.5)
WBC: 7.5 10*3/uL (ref 4.0–10.5)

## 2018-08-17 LAB — HEMOGLOBIN A1C: HEMOGLOBIN A1C: 8.5 % — AB (ref 4.6–6.5)

## 2018-08-17 LAB — PSA: PSA: 0.77 ng/mL (ref 0.10–4.00)

## 2018-08-17 MED ORDER — TRAMADOL HCL 50 MG PO TABS: 50.0000 mg | ORAL_TABLET | Freq: Two times a day (BID) | ORAL | 0 refills | Status: DC | PRN

## 2018-08-17 MED ORDER — LOSARTAN POTASSIUM 50 MG PO TABS: 50.0000 mg | ORAL_TABLET | Freq: Every day | ORAL | 3 refills | Status: DC

## 2018-08-17 NOTE — Patient Instructions (Addendum)
EKG looks great I will be in touch with your labs Best of luck with your operation and recovery!  We will increase your losartan to 50mg  for your BP

## 2018-08-18 MED ORDER — METFORMIN HCL 500 MG PO TABS: 500.0000 mg | ORAL_TABLET | Freq: Two times a day (BID) | ORAL | 3 refills | Status: AC

## 2018-08-19 LAB — HEPATITIS C ANTIBODY
Hepatitis C Ab: NONREACTIVE
SIGNAL TO CUT-OFF: 0.01 (ref <1.00)

## 2018-08-22 ENCOUNTER — Encounter: Payer: Self-pay | Admitting: Family Medicine

## 2018-08-25 ENCOUNTER — Encounter: Payer: Self-pay | Admitting: Family Medicine

## 2018-08-29 ENCOUNTER — Encounter: Payer: Self-pay | Admitting: Family Medicine

## 2018-09-12 ENCOUNTER — Encounter: Payer: Self-pay | Admitting: Family Medicine

## 2018-09-12 MED ORDER — CYCLOBENZAPRINE HCL 10 MG PO TABS: 5.0000 mg | ORAL_TABLET | Freq: Two times a day (BID) | ORAL | 1 refills | Status: AC | PRN

## 2018-09-25 ENCOUNTER — Other Ambulatory Visit: Payer: Self-pay | Admitting: Family Medicine

## 2018-09-25 ENCOUNTER — Encounter: Payer: Self-pay | Admitting: Family Medicine

## 2018-09-25 DIAGNOSIS — M7581 Other shoulder lesions, right shoulder: Secondary | ICD-10-CM

## 2018-09-25 DIAGNOSIS — I10 Essential (primary) hypertension: Secondary | ICD-10-CM

## 2018-09-26 MED ORDER — TRAMADOL HCL 50 MG PO TABS: 50.0000 mg | ORAL_TABLET | Freq: Two times a day (BID) | ORAL | 0 refills | Status: AC | PRN

## 2018-11-18 ENCOUNTER — Encounter: Payer: Self-pay | Admitting: Family Medicine

## 2018-11-21 ENCOUNTER — Other Ambulatory Visit (INDEPENDENT_AMBULATORY_CARE_PROVIDER_SITE_OTHER): Payer: PRIVATE HEALTH INSURANCE

## 2018-11-21 ENCOUNTER — Other Ambulatory Visit: Payer: Self-pay

## 2018-11-21 DIAGNOSIS — E119 Type 2 diabetes mellitus without complications: Secondary | ICD-10-CM

## 2018-11-28 ENCOUNTER — Encounter: Payer: Self-pay | Admitting: Family Medicine

## 2018-12-01 ENCOUNTER — Encounter: Payer: Self-pay | Admitting: Family Medicine

## 2018-12-01 DIAGNOSIS — E119 Type 2 diabetes mellitus without complications: Secondary | ICD-10-CM

## 2019-01-02 MED FILL — CYCLOBENZAPRINE HCL 10 MG T: 10 | 10 days supply | Qty: 30 | Fill #0

## 2019-01-02 MED FILL — GABAPENTIN 100 MG CAPSULE: 100 | 30 days supply | Qty: 90 | Fill #0

## 2019-01-02 MED FILL — metFORMIN HCL 500 MG TABS: 500 | 30 days supply | Qty: 60 | Fill #0

## 2019-02-06 MED FILL — ANDRODERM 4 MG/24HR PT24: 4 | 30 days supply | Qty: 30 | Fill #0

## 2019-02-22 MED FILL — GABAPENTIN 100 MG CAPSULE: 100 | 30 days supply | Qty: 90 | Fill #1

## 2019-02-27 MED FILL — CYCLOBENZAPRINE HCL 10 MG T: 10 | 10 days supply | Qty: 30 | Fill #1

## 2019-02-27 MED FILL — LOSARTAN POTASSIUM 50 MG TA: 50 | 30 days supply | Qty: 30 | Fill #0

## 2019-02-27 MED FILL — AMLODIPINE BESYLATE 10 MG T: 10 | 30 days supply | Qty: 30 | Fill #0

## 2019-02-27 MED FILL — metFORMIN HCL 500 MG TABS: 500 | 30 days supply | Qty: 60 | Fill #1

## 2019-03-03 MED FILL — CYCLOBENZAPRINE HCL 10 MG T: 10 | 20 days supply | Qty: 60 | Fill #0

## 2019-03-18 MED FILL — CYCLOBENZAPRINE HCL 10 MG T: 10 | 30 days supply | Qty: 30 | Fill #0

## 2019-03-24 MED FILL — AMLODIPINE BESYLATE 10 MG T: 10 | 30 days supply | Qty: 30 | Fill #1

## 2019-03-24 MED FILL — metFORMIN HCL 500 MG TABS: 500 | 30 days supply | Qty: 60 | Fill #2

## 2019-04-02 MED FILL — metFORMIN HCL 500 MG TABS: 500 | 30 days supply | Qty: 60 | Fill #2

## 2019-04-02 MED FILL — AMLODIPINE BESYLATE 10 MG T: 10 | 30 days supply | Qty: 30 | Fill #1

## 2019-05-05 MED FILL — metFORMIN HCL 500 MG TABS: 500 | 30 days supply | Qty: 60 | Fill #3 | Status: TO

## 2019-05-05 MED FILL — AMLODIPINE BESYLATE 10 MG T: 10 | 30 days supply | Qty: 30 | Fill #2 | Status: TO

## 2020-02-02 IMAGING — MR MR SHOULDER*R* W/O CM
5 series · 35 of 40 positions shown · non-contrast
Comparison: None.

CLINICAL DATA: Right shoulder pain for 1 month. Reaching injury
about 1 month ago.

EXAM:
MRI OF THE RIGHT SHOULDER WITHOUT CONTRAST
TECHNIQUE: Multiplanar, multisequence MR imaging of the shoulder was performed.
No intravenous contrast was administered.

[Series 3: PD fat-sat · axial · 4.0mm · 0.59mm/px · z∈[-7,+84]mm · 8 of 22 slices shown (1 of 2)]
[im 1/22]
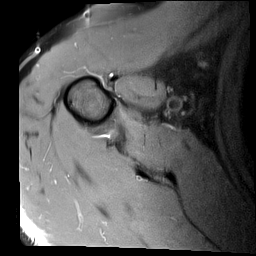
[im 3/22]
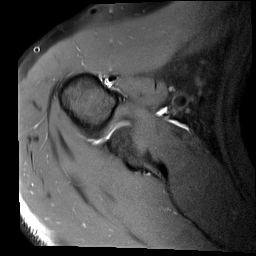
[im 8/22]
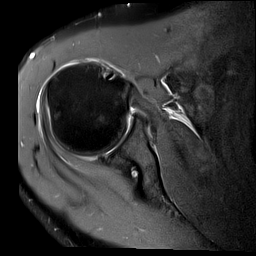
[im 10/22]
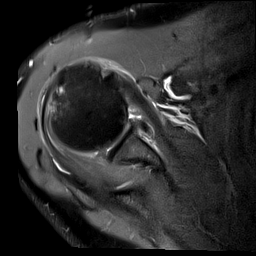
[im 12/22]
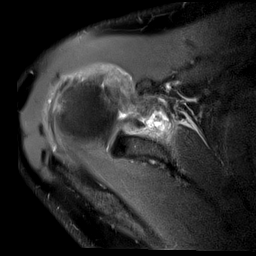
[im 15/22]
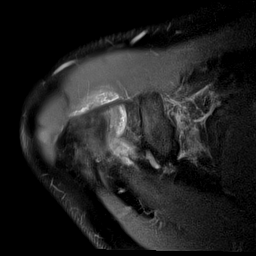
[im 19/22]
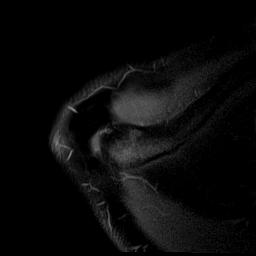
[im 22/22]
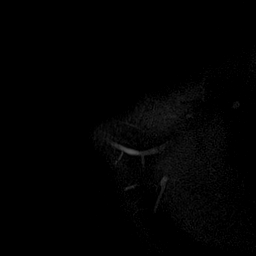

[Series 4: T1 · oblique · 4.0mm · 0.27mm/px · 5 of 20 slices shown]
[im 1/20]
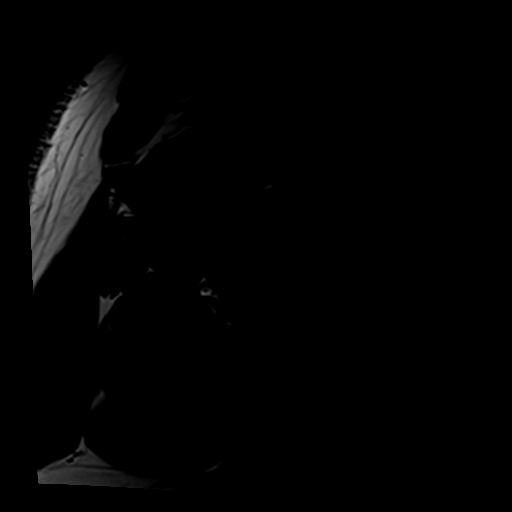
[im 3/20]
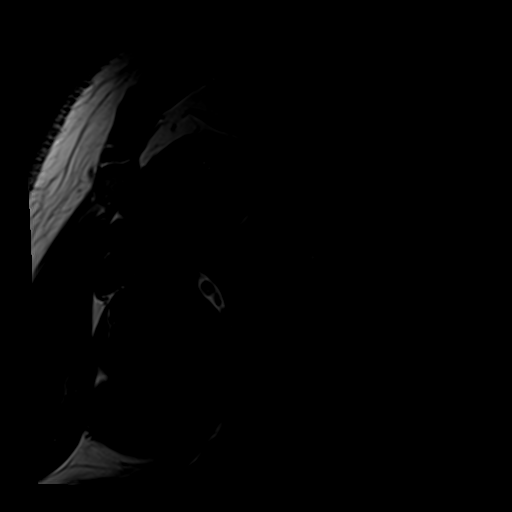
[im 6/20]
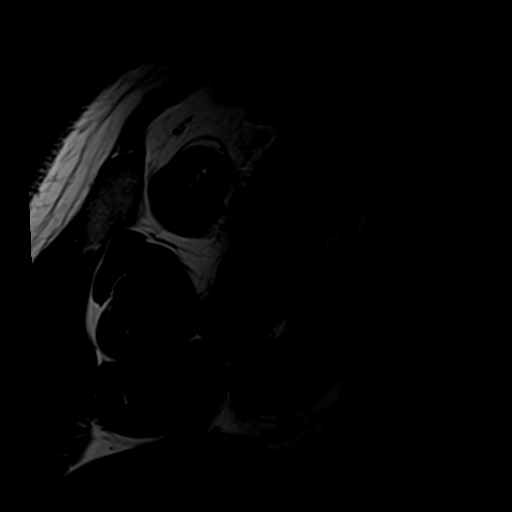
[im 9/20]
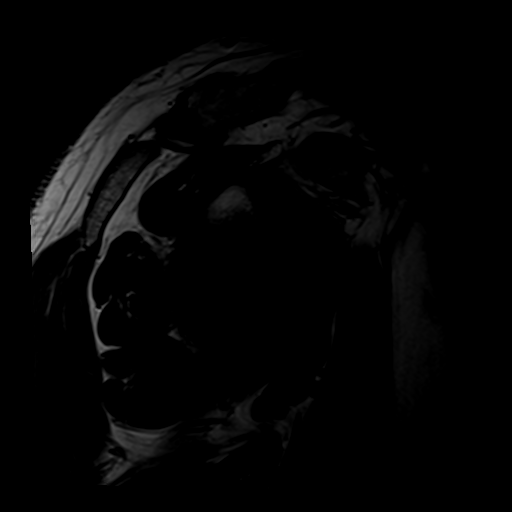
[im 11/20]
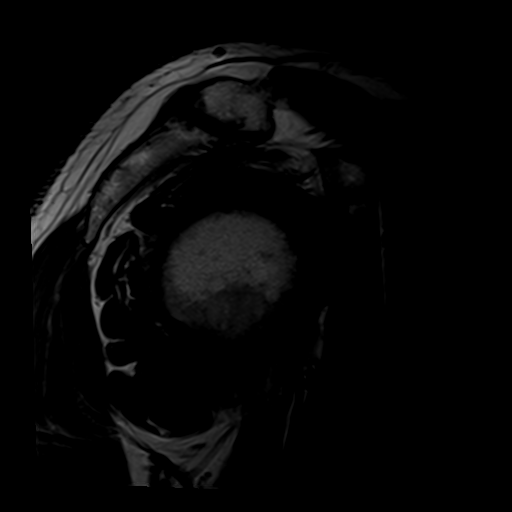

[Series 5: T2 fat-sat · oblique · 4.0mm · 0.55mm/px · 8 of 20 slices shown (1 of 2)]
[im 1/20]
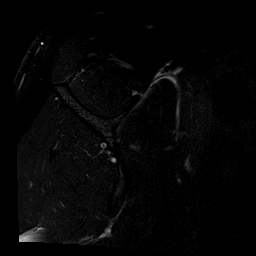
[im 3/20]
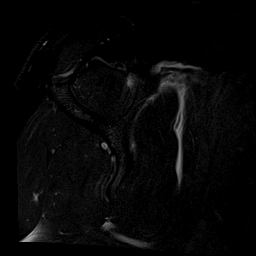
[im 6/20]
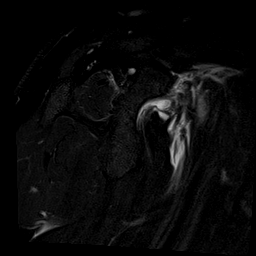
[im 9/20]
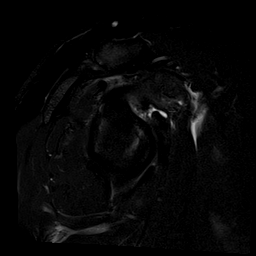
[im 11/20]
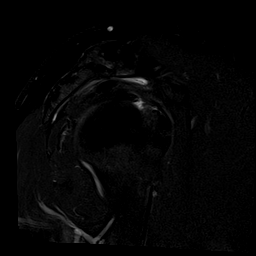
[im 14/20]
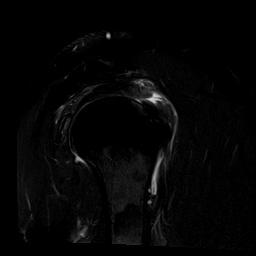
[im 17/20]
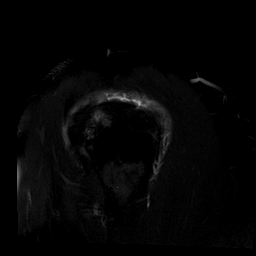
[im 20/20]
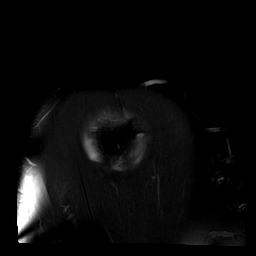

[Series 6: T2 fat-sat · oblique · 4.0mm · 0.55mm/px · 7 of 18 slices shown (2 of 2)]
[im 1/18]
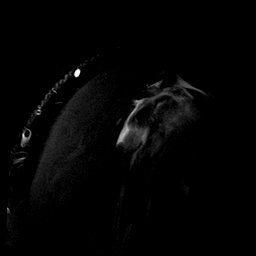
[im 3/18]
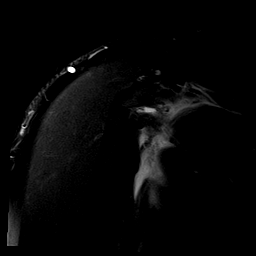
[im 6/18]
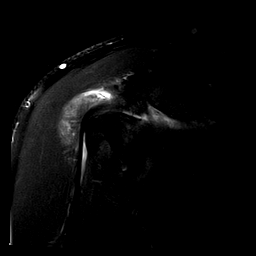
[im 9/18]
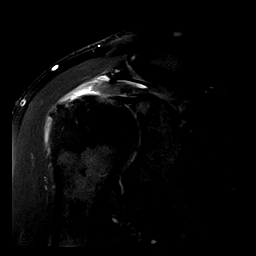
[im 12/18]
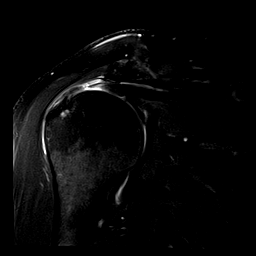
[im 15/18]
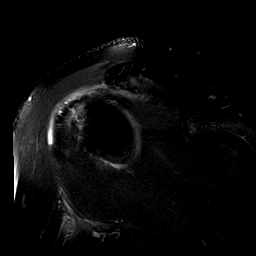
[im 18/18]
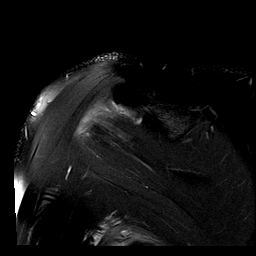

[Series 7: PD fat-sat · oblique · 4.0mm · 0.27mm/px · 7 of 18 slices shown (2 of 2)]
[im 1/18]
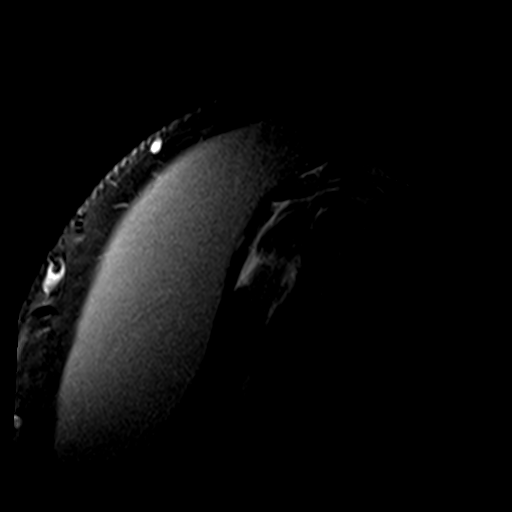
[im 3/18]
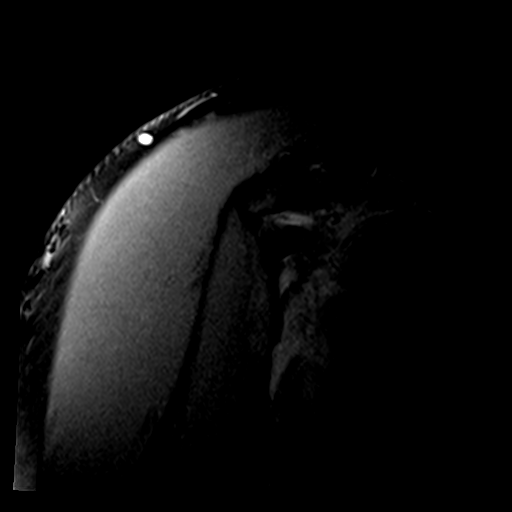
[im 6/18]
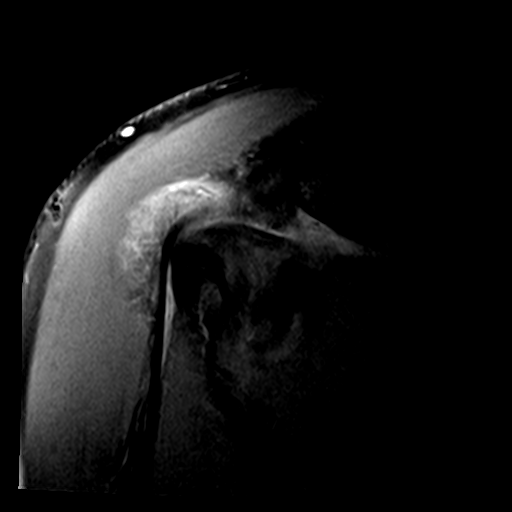
[im 9/18]
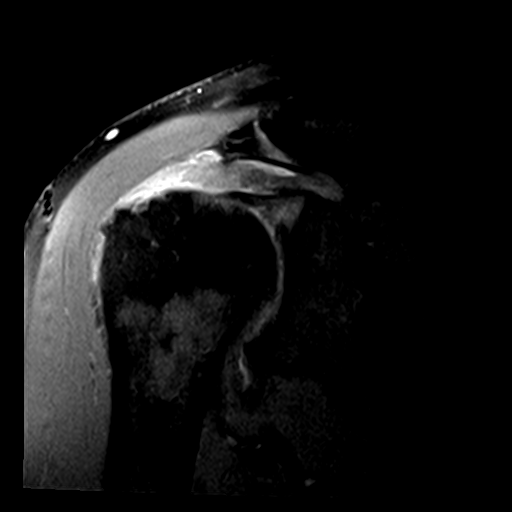
[im 12/18]
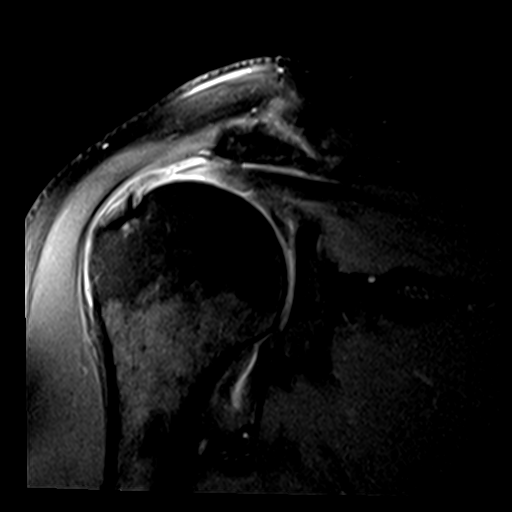
[im 15/18]
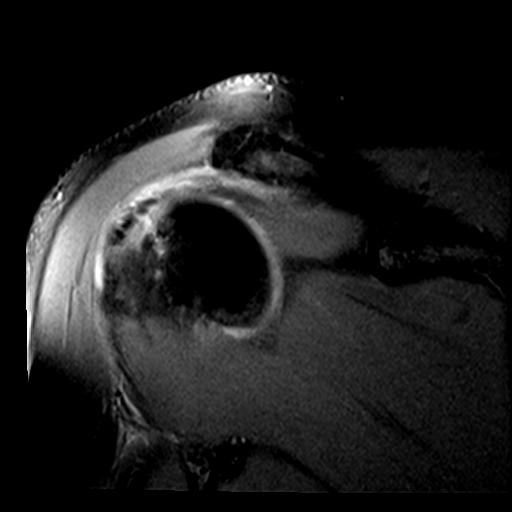
[im 18/18]
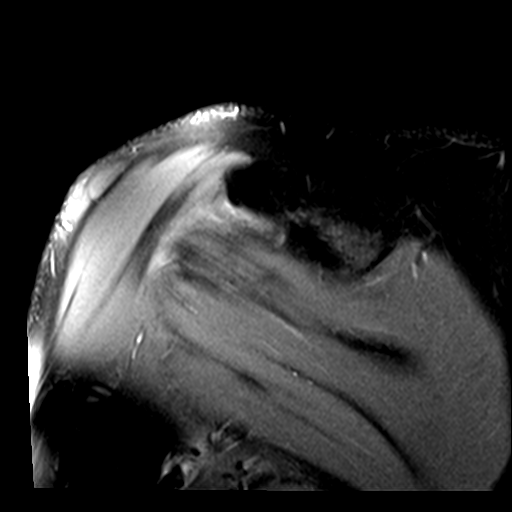

[35 of 40 positions shown; findings below may reference images not displayed]

FINDINGS: Rotator cuff: Full-thickness, nearly full width tear of the
supraspinatus tendon, torn portion retracted 2.0 cm. There is
questionably some irregular residual tendon posteriorly still
attaching to the humeral head although this is questionable. Mild
infraspinatus tendinopathy with edema tracking along the
infraspinatus myotendinous junction.

Muscles:  Unremarkable

Biceps long head:  Unremarkable

Acromioclavicular Joint: Mild spurring and mild subcortical marrow
edema. Type II acromion. As expected there is fluid in the
subacromial subdeltoid bursa.

Glenohumeral Joint: Mild degenerative glenohumeral chondral
thinning.

Labrum: High suspicion for nondisplaced posterosuperior labral tear
on image [DATE].

Bones: No significant extra-articular osseous abnormalities
identified.

Other: No supplemental non-categorized findings.
IMPRESSION: 1. Full-thickness, nearly full width tear of the supraspinatus
tendon, with the torn portion retracted 2.0 cm. Severe tendinopathy
of the questionably still intact posterior margin of the tendon.
2. Mild infraspinatus tendinopathy.
3. Mild degenerative AC joint arthropathy and mild degenerative
glenohumeral chondral thinning.
4. High suspicion for a focal nondisplaced posterosuperior labral
tear based on the appearance on image [DATE].

## 2020-02-23 MED FILL — GLIPIZIDE ER 2.5 MG TB24: 2.5 | 30 days supply | Qty: 30 | Fill #0

## 2020-02-23 MED FILL — AMLODIPINE BESYLATE 10 MG T: 10 | 30 days supply | Qty: 30 | Fill #0

## 2020-02-23 MED FILL — ATORVASTATIN CALCIUM 10 MG: 10 | 30 days supply | Qty: 30 | Fill #0

## 2020-02-23 MED FILL — LOSARTAN POTASSIUM 50 MG TA: 50 | 30 days supply | Qty: 30 | Fill #0

## 2022-08-24 ENCOUNTER — Ambulatory Visit (INDEPENDENT_AMBULATORY_CARE_PROVIDER_SITE_OTHER): Payer: Managed Care, Other (non HMO)

## 2022-08-24 ENCOUNTER — Encounter: Payer: Self-pay | Admitting: Physician Assistant

## 2022-08-24 ENCOUNTER — Ambulatory Visit: Payer: Managed Care, Other (non HMO) | Admitting: Physician Assistant

## 2022-08-24 DIAGNOSIS — M25562 Pain in left knee: Secondary | ICD-10-CM | POA: Diagnosis not present

## 2022-08-24 DIAGNOSIS — M25561 Pain in right knee: Secondary | ICD-10-CM

## 2022-08-24 MED ORDER — BUPIVACAINE HCL 0.25 % IJ SOLN
2.0000 mL | INTRAMUSCULAR | Status: AC | PRN
  Administered 2022-08-24: 2 mL via INTRA_ARTICULAR

## 2022-08-24 MED ORDER — METHYLPREDNISOLONE ACETATE 40 MG/ML IJ SUSP
80.0000 mg | INTRAMUSCULAR | Status: AC | PRN
  Administered 2022-08-24: 80 mg via INTRA_ARTICULAR

## 2022-08-24 MED ORDER — LIDOCAINE HCL 1 % IJ SOLN
5.0000 mL | INTRAMUSCULAR | Status: AC | PRN
  Administered 2022-08-24: 5 mL
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
